# Patient Record
Sex: Female | Born: 1955
Health system: Southern US, Community
[De-identification: ages and names within clinical notes are randomized; demographics above are authoritative.]

## PROBLEM LIST (undated history)

## (undated) DIAGNOSIS — T7840XA Allergy, unspecified, initial encounter: Secondary | ICD-10-CM

## (undated) DIAGNOSIS — G4700 Insomnia, unspecified: Secondary | ICD-10-CM

## (undated) DIAGNOSIS — R0600 Dyspnea, unspecified: Secondary | ICD-10-CM

## (undated) DIAGNOSIS — F329 Major depressive disorder, single episode, unspecified: Secondary | ICD-10-CM

## (undated) DIAGNOSIS — F32A Depression, unspecified: Secondary | ICD-10-CM

## (undated) DIAGNOSIS — T753XXA Motion sickness, initial encounter: Secondary | ICD-10-CM

## (undated) HISTORY — DX: Allergy, unspecified, initial encounter: T78.40XA

## (undated) HISTORY — DX: Insomnia, unspecified: G47.00

---

## 2008-01-07 HISTORY — PX: COLONOSCOPY: SHX174

## 2015-09-28 ENCOUNTER — Ambulatory Visit (INDEPENDENT_AMBULATORY_CARE_PROVIDER_SITE_OTHER): Payer: 59 | Admitting: Family Medicine

## 2015-09-28 ENCOUNTER — Encounter: Payer: Self-pay | Admitting: Family Medicine

## 2015-09-28 VITALS — BP 120/78 | HR 78 | Ht 66.0 in | Wt 231.0 lb

## 2015-09-28 DIAGNOSIS — Z7689 Persons encountering health services in other specified circumstances: Secondary | ICD-10-CM

## 2015-09-28 DIAGNOSIS — G4700 Insomnia, unspecified: Secondary | ICD-10-CM

## 2015-09-28 DIAGNOSIS — Z7189 Other specified counseling: Secondary | ICD-10-CM | POA: Diagnosis not present

## 2015-09-28 DIAGNOSIS — F329 Major depressive disorder, single episode, unspecified: Secondary | ICD-10-CM | POA: Insufficient documentation

## 2015-09-28 DIAGNOSIS — F32A Depression, unspecified: Secondary | ICD-10-CM

## 2015-09-28 MED ORDER — BUPROPION HCL 75 MG PO TABS: 75.0000 mg | ORAL_TABLET | Freq: Two times a day (BID) | ORAL | 2 refills | Status: DC

## 2015-09-28 MED ORDER — TRAZODONE HCL 100 MG PO TABS: 100.0000 mg | ORAL_TABLET | Freq: Every day | ORAL | 3 refills | Status: DC

## 2015-09-28 NOTE — Progress Notes (Signed)
Name: Rachel Burton   MRN: [id number]    DOB: [birth year]   Date:09/28/2015       Progress Note  Subjective  Chief Complaint  Chief Complaint  Patient presents with  . Establish Care    new to area- in June    Patient presents for establishment of care.   Depression         The patient presents with no depression.  This is a chronic problem.  The current episode started more than 1 month ago.   The onset quality is gradual.   The problem occurs daily.  The problem has been waxing and waning since onset.  Associated symptoms include sad.  Associated symptoms include no decreased concentration, no fatigue, no helplessness, no hopelessness, does not have insomnia, not irritable, no restlessness, no decreased interest, no appetite change, no body aches, no myalgias, no headaches, no indigestion and no suicidal ideas.     The symptoms are aggravated by work stress.  Treatments tried: viibrid.  Compliance with treatment is good.  Past compliance problems include medication issues (prior medications not tolereated).   Pertinent negatives include no chronic pain, no fibromyalgia, no hypothyroidism, no Alzheimer's disease, no dementia and no depression.   No problem-specific Assessment & Plan notes found for this encounter.   Past Medical History:  Diagnosis Date  . Insomnia     Past Surgical History:  Procedure Laterality Date  . COLONOSCOPY  2010   cleared for 10 years    Family History  Problem Relation Age of Onset  . Cancer Mother   . Heart disease Maternal Grandmother   . Heart disease Maternal Grandfather     Social History   Social History  . Marital status: Unknown    Spouse name: N/A  . Number of children: N/A  . Years of education: N/A   Occupational History  . Not on file.   Social History Main Topics  . Smoking status: Former Games developermoker  . Smokeless tobacco: Never Used  . Alcohol use No  . Drug use: No  . Sexual activity: No   Other Topics Concern  . Not on  file   Social History Narrative  . No narrative on file    Allergies  Allergen Reactions  . Ambien [Zolpidem]   . Levaquin [Levofloxacin]      Review of Systems  Constitutional: Negative for appetite change, chills, fatigue, fever, malaise/fatigue and weight loss.  HENT: Negative for ear discharge, ear pain and sore throat.   Eyes: Negative for blurred vision.  Respiratory: Negative for cough, sputum production, shortness of breath and wheezing.   Cardiovascular: Negative for chest pain, palpitations and leg swelling.  Gastrointestinal: Negative for abdominal pain, blood in stool, constipation, diarrhea, heartburn, melena and nausea.  Genitourinary: Negative for dysuria, frequency, hematuria and urgency.  Musculoskeletal: Negative for back pain, joint pain, myalgias and neck pain.  Skin: Negative for rash.  Neurological: Negative for dizziness, tingling, sensory change, focal weakness and headaches.  Endo/Heme/Allergies: Negative for environmental allergies and polydipsia. Does not bruise/bleed easily.  Psychiatric/Behavioral: Negative for decreased concentration, depression and suicidal ideas. The patient is not nervous/anxious and does not have insomnia.      Objective  Vitals:   09/28/15 1359  BP: 120/78  Pulse: 78  Weight: 231 lb (104.8 kg)  Height: 5\' 6"  (1.676 m)    Physical Exam  Constitutional: She is well-developed, well-nourished, and in no distress. She is not irritable. No distress.  HENT:  Head: Normocephalic and  atraumatic.  Right Ear: External ear normal.  Left Ear: External ear normal.  Nose: Nose normal.  Mouth/Throat: Oropharynx is clear and moist.  Eyes: Conjunctivae and EOM are normal. Pupils are equal, round, and reactive to light. Right eye exhibits no discharge. Left eye exhibits no discharge.  Neck: Normal range of motion. Neck supple. No JVD present. No thyromegaly present.  Cardiovascular: Normal rate, regular rhythm, normal heart sounds and  intact distal pulses.  Exam reveals no gallop and no friction rub.   No murmur heard. Pulmonary/Chest: Effort normal and breath sounds normal. She has no wheezes. She has no rales.  Abdominal: Soft. Bowel sounds are normal. She exhibits no mass. There is no tenderness. There is no guarding.  Musculoskeletal: Normal range of motion. She exhibits no edema.  Lymphadenopathy:    She has no cervical adenopathy.  Neurological: She is alert.  Skin: Skin is warm and dry. She is not diaphoretic.  Psychiatric: Mood and affect normal.  Nursing note and vitals reviewed.     Assessment & Plan  Problem List Items Addressed This Visit      Other   Insomnia   Relevant Medications   traZODone (DESYREL) 100 MG tablet   Depression   Relevant Medications   Vilazodone HCl (VIIBRYD) 40 MG TABS   traZODone (DESYREL) 100 MG tablet   buPROPion (WELLBUTRIN) 75 MG tablet    Other Visit Diagnoses    Encounter to establish care    -  Primary        Dr. Elizabeth Sauer [facility]  09/28/15

## 2015-10-11 DIAGNOSIS — H40003 Preglaucoma, unspecified, bilateral: Secondary | ICD-10-CM | POA: Diagnosis not present

## 2015-11-07 DIAGNOSIS — H40003 Preglaucoma, unspecified, bilateral: Secondary | ICD-10-CM | POA: Diagnosis not present

## 2015-11-13 DIAGNOSIS — H40003 Preglaucoma, unspecified, bilateral: Secondary | ICD-10-CM | POA: Diagnosis not present

## 2015-12-28 ENCOUNTER — Encounter: Payer: Self-pay | Admitting: Family Medicine

## 2015-12-28 ENCOUNTER — Ambulatory Visit (INDEPENDENT_AMBULATORY_CARE_PROVIDER_SITE_OTHER): Payer: PRIVATE HEALTH INSURANCE | Admitting: Family Medicine

## 2015-12-28 VITALS — BP 124/80 | HR 60 | Ht 66.0 in | Wt 232.0 lb

## 2015-12-28 DIAGNOSIS — F5101 Primary insomnia: Secondary | ICD-10-CM | POA: Diagnosis not present

## 2015-12-28 DIAGNOSIS — F3341 Major depressive disorder, recurrent, in partial remission: Secondary | ICD-10-CM | POA: Diagnosis not present

## 2015-12-28 MED ORDER — TRAZODONE HCL 100 MG PO TABS: 100.0000 mg | ORAL_TABLET | Freq: Every day | ORAL | 3 refills | Status: DC

## 2015-12-28 MED ORDER — BUPROPION HCL 75 MG PO TABS: 75.0000 mg | ORAL_TABLET | Freq: Two times a day (BID) | ORAL | 3 refills | Status: DC

## 2015-12-28 NOTE — Progress Notes (Signed)
Name: Rachel Burton   MRN: [id number]    DOB: 1955/02/03   Date:12/28/2015       Progress Note  Subjective  Chief Complaint  Chief Complaint  Patient presents with  . Depression    started Bupropion- "doing great on med"- needs refill on both Bupropion and Trazadone    Depression         This is a new problem.  The current episode started more than 1 year ago.   The onset quality is gradual.   The problem occurs intermittently.  The problem has been gradually improving since onset.  Associated symptoms include no decreased concentration, no fatigue, no helplessness, no hopelessness, does not have insomnia, not irritable, no restlessness, no decreased interest, no appetite change, no body aches, no myalgias, no headaches, no indigestion, not sad and no suicidal ideas.     The symptoms are aggravated by nothing.  Treatments tried: buspirone.  Compliance with treatment is good.  Previous treatment provided moderate relief.  Risk factors include a change in medication usage/dosage.    Pertinent negatives include no chronic fatigue syndrome and no chronic pain.   No problem-specific Assessment & Plan notes found for this encounter.   Past Medical History:  Diagnosis Date  . Insomnia     Past Surgical History:  Procedure Laterality Date  . COLONOSCOPY  2010   cleared for 10 years    Family History  Problem Relation Age of Onset  . Cancer Mother   . Heart disease Maternal Grandmother   . Heart disease Maternal Grandfather     Social History   Social History  . Marital status: Unknown    Spouse name: N/A  . Number of children: N/A  . Years of education: N/A   Occupational History  . Not on file.   Social History Main Topics  . Smoking status: Former Games developermoker  . Smokeless tobacco: Never Used  . Alcohol use No  . Drug use: No  . Sexual activity: No   Other Topics Concern  . Not on file   Social History Narrative  . No narrative on file    Allergies  Allergen  Reactions  . Ambien [Zolpidem]   . Levaquin [Levofloxacin]      Review of Systems  Constitutional: Negative for appetite change, chills, fatigue, fever, malaise/fatigue and weight loss.  HENT: Negative for ear discharge, ear pain and sore throat.   Eyes: Negative for blurred vision.  Respiratory: Negative for cough, sputum production, shortness of breath and wheezing.   Cardiovascular: Negative for chest pain, palpitations and leg swelling.  Gastrointestinal: Negative for abdominal pain, blood in stool, constipation, diarrhea, heartburn, melena and nausea.  Genitourinary: Negative for dysuria, frequency, hematuria and urgency.  Musculoskeletal: Negative for back pain, joint pain, myalgias and neck pain.  Skin: Negative for rash.  Neurological: Negative for dizziness, tingling, sensory change, focal weakness and headaches.  Endo/Heme/Allergies: Negative for environmental allergies and polydipsia. Does not bruise/bleed easily.  Psychiatric/Behavioral: Positive for depression. Negative for decreased concentration and suicidal ideas. The patient is not nervous/anxious and does not have insomnia.      Objective  Vitals:   12/28/15 0914  BP: 124/80  Pulse: 60  Weight: 232 lb (105.2 kg)  Height: 5\' 6"  (1.676 m)    Physical Exam  Constitutional: She is well-developed, well-nourished, and in no distress. She is not irritable. No distress.  HENT:  Head: Normocephalic and atraumatic.  Right Ear: External ear normal.  Left Ear: External ear normal.  Nose: Nose normal.  Mouth/Throat: Oropharynx is clear and moist.  Eyes: Conjunctivae and EOM are normal. Pupils are equal, round, and reactive to light. Right eye exhibits no discharge. Left eye exhibits no discharge.  Neck: Normal range of motion. Neck supple. No JVD present. No thyromegaly present.  Cardiovascular: Normal rate, regular rhythm, normal heart sounds and intact distal pulses.  Exam reveals no gallop and no friction rub.   No  murmur heard. Pulmonary/Chest: Effort normal and breath sounds normal. She has no wheezes. She has no rales.  Abdominal: Soft. Bowel sounds are normal. She exhibits no mass. There is no tenderness. There is no guarding.  Musculoskeletal: Normal range of motion. She exhibits no edema.  Lymphadenopathy:    She has no cervical adenopathy.  Neurological: She is alert. She has normal reflexes.  Skin: Skin is warm and dry. She is not diaphoretic.  Psychiatric: Mood and affect normal.  Nursing note and vitals reviewed.     Assessment & Plan  Problem List Items Addressed This Visit      Other   Insomnia   Relevant Medications   traZODone (DESYREL) 100 MG tablet   Depression - Primary   Relevant Medications   buPROPion (WELLBUTRIN) 75 MG tablet   traZODone (DESYREL) 100 MG tablet        Dr. Hayden Rasmusseneanna Azyria [facility]mon [facility]  12/28/15

## 2016-01-10 ENCOUNTER — Ambulatory Visit: Payer: PRIVATE HEALTH INSURANCE | Admitting: Family Medicine

## 2016-01-10 ENCOUNTER — Ambulatory Visit
Admission: EM | Admit: 2016-01-10 | Discharge: 2016-01-10 | Disposition: A | Discharge status: Home / Self Care | Payer: No Typology Code available for payment source | Attending: Family Medicine | Admitting: Family Medicine

## 2016-01-10 DIAGNOSIS — J069 Acute upper respiratory infection, unspecified: Secondary | ICD-10-CM | POA: Diagnosis not present

## 2016-01-10 HISTORY — DX: Major depressive disorder, single episode, unspecified: F32.9

## 2016-01-10 HISTORY — DX: Depression, unspecified: F32.A

## 2016-01-10 LAB — RAPID INFLUENZA A&B ANTIGENS (ARMC ONLY): INFLUENZA A (ARMC): NEGATIVE

## 2016-01-10 LAB — RAPID INFLUENZA A&B ANTIGENS: Influenza B (ARMC): NEGATIVE

## 2016-01-10 MED ORDER — ALBUTEROL SULFATE HFA 108 (90 BASE) MCG/ACT IN AERS: 1.0000 | INHALATION_SPRAY | Freq: Four times a day (QID) | RESPIRATORY_TRACT | 0 refills | Status: DC | PRN

## 2016-01-10 MED ORDER — BENZONATATE 200 MG PO CAPS: ORAL_CAPSULE | ORAL | 0 refills | Status: DC

## 2016-01-10 MED ORDER — HYDROCOD POLST-CPM POLST ER 10-8 MG/5ML PO SUER: 5.0000 mL | Freq: Two times a day (BID) | ORAL | 0 refills | Status: DC

## 2016-01-10 MED ORDER — AZITHROMYCIN 250 MG PO TABS: 250.0000 mg | ORAL_TABLET | Freq: Every day | ORAL | 0 refills | Status: DC

## 2016-01-10 NOTE — ED Provider Notes (Signed)
CSN: [id number]     Arrival date & time 01/10/16  1252 History   First MD Initiated Contact with Patient 01/10/16 1554     Chief Complaint  Patient presents with  . Cough   (Consider location/radiation/quality/duration/timing/severity/associated sxs/prior Treatment) HPI  This a 61 year old female who presents with a nonproductive cough bloody mucus from her nose and mild shortness of breath. She's had this for about a week. His not improving. Is coughing all night long. 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 She has a temperature of 100F pressure 135/78 pulse 92 respirations 20 O2 sat on room air is 95%      Past Medical History:  Diagnosis Date  . Depression   . Insomnia    Past Surgical History:  Procedure Laterality Date  . COLONOSCOPY  2010   cleared for 10 years   Family History  Problem Relation Age of Onset  . Cancer Mother   . Heart disease Maternal Grandmother   . Heart disease Maternal Grandfather    Social History  Substance Use Topics  . Smoking status: Never Smoker  . Smokeless tobacco: Never Used  . Alcohol use No   OB History    No data available     Review of Systems  Constitutional: Positive for activity change. Negative for chills, fatigue and fever.  HENT: Positive for congestion, postnasal drip, rhinorrhea, sinus pain, sinus pressure and sneezing.   Respiratory: Positive for cough and shortness of breath.   Cardiovascular: Positive for chest pain.  All other systems reviewed and are negative.   Allergies  Ambien [zolpidem] and Levaquin [levofloxacin]  Home Medications   Prior to Admission medications   Medication Sig Start Date End Date Taking? Authorizing Provider  albuterol (PROVENTIL HFA;VENTOLIN HFA) 108 (90 Base) MCG/ACT inhaler Inhale 1-2 puffs into the lungs every 6 (six) hours as needed for wheezing or shortness of breath. Use with spacer 01/10/16   Lutricia FeilWilliam P Heiress Williamson, PA-C  azithromycin (ZITHROMAX) 250 MG tablet  Take 1 tablet (250 mg total) by mouth daily. Take first 2 tablets together, then 1 every day until finished. 01/10/16   Lutricia FeilWilliam P Arianny Pun, PA-C  benzonatate (TESSALON) 200 MG capsule Take 1 capsule 3 times daily as necessary for cough 01/10/16   Lutricia FeilWilliam P Aren Pryde, PA-C  buPROPion (WELLBUTRIN) 75 MG tablet Take 1 tablet (75 mg total) by mouth 2 (two) times daily. 12/28/15   Duanne Limerickeanna C Jones, MD  chlorpheniramine-HYDROcodone (TUSSIONEX PENNKINETIC ER) 10-8 MG/5ML SUER Take 5 mLs by mouth 2 (two) times daily. 01/10/16   Lutricia FeilWilliam P Rendi Mapel, PA-C  traZODone (DESYREL) 100 MG tablet Take 1 tablet (100 mg total) by mouth at bedtime. 12/28/15   Duanne Limerickeanna C Jones, MD   Meds Ordered and Administered this Visit  Medications - No data to display  BP 135/78 (BP Location: Left Arm)   Pulse 92   Temp 100 F (37.8 C) (Oral)   Resp 20   Ht 5\' 5"  (1.651 m)   Wt 223 lb (101.2 kg)   SpO2 95%   BMI 37.11 kg/m  No data found.   Physical Exam  Constitutional: She is oriented to person, place, and time. She appears well-developed and well-nourished. No distress.  HENT:  Head: Normocephalic and atraumatic.  Right Ear: External ear normal.  Left Ear: External ear normal.  Nose: Nose normal.  Mouth/Throat: Oropharynx is clear and moist. No oropharyngeal exudate.  Patient does have  tenderness to percussion over the maxillary sinuses  Eyes: EOM are normal. Pupils are equal, round, and reactive to light. Right eye exhibits no discharge. Left eye exhibits no discharge.  Neck: Normal range of motion. Neck supple.  Pulmonary/Chest: Effort normal and breath sounds normal. No respiratory distress. She has no wheezes. She has no rales.  Musculoskeletal: Normal range of motion.  Lymphadenopathy:    She has no cervical adenopathy.  Neurological: She is alert and oriented to person, place, and time.  Skin: Skin is warm and dry. She is not diaphoretic.  Psychiatric: She has a normal mood and affect. Her behavior is normal. Judgment  and thought content normal.  Nursing note and vitals reviewed.   Urgent Care Course   Clinical Course     Procedures (including critical care time)  Labs Review Labs Reviewed  RAPID INFLUENZA A&B ANTIGENS [facility] ONLY)    Imaging Review No results found.   Visual Acuity Review  Right Eye Distance:   Left Eye Distance:   Bilateral Distance:    Right Eye Near:   Left Eye Near:    Bilateral Near:         MDM   1. Acute upper respiratory infection    Discharge Medication List as of 01/10/2016  4:10 PM    START taking these medications   Details  albuterol (PROVENTIL HFA;VENTOLIN HFA) 108 (90 Base) MCG/ACT inhaler Inhale 1-2 puffs into the lungs every 6 (six) hours as needed for wheezing or shortness of breath. Use with spacer, Starting Thu 01/10/2016, Normal    azithromycin (ZITHROMAX) 250 MG tablet Take 1 tablet (250 mg total) by mouth daily. Take first 2 tablets together, then 1 every day until finished., Starting Thu 01/10/2016, Normal    benzonatate (TESSALON) 200 MG capsule Take 1 capsule 3 times daily as necessary for cough, Normal    chlorpheniramine-HYDROcodone (TUSSIONEX PENNKINETIC ER) 10-8 MG/5ML SUER Take 5 mLs by mouth 2 (two) times daily., Starting Thu 01/10/2016, Print      Plan: 1. Test/x-ray results and diagnosis reviewed with patient 2. rx as per orders; risks, benefits, potential side effects reviewed with patient 3. Recommend supportive treatment with Rest and fluids. Use albuterol for shortness of breath. And cautioned her regarding use of Tussionex with activities requiring concentration are judgment. She is not improving or if she runs high fevers or increased sputum production she should return to our clinic for follow-up or see her primary care physician 4. F/u prn if symptoms worsen or don't improve     Lutricia Feil, PA-C 01/10/16 1652

## 2016-01-10 NOTE — ED Triage Notes (Addendum)
Pt with dry cough, blowing bloody secretions from nose.Rachel Burton. No pain

## 2016-04-28 ENCOUNTER — Encounter: Payer: Self-pay | Admitting: Family Medicine

## 2016-04-28 ENCOUNTER — Ambulatory Visit (INDEPENDENT_AMBULATORY_CARE_PROVIDER_SITE_OTHER): Payer: No Typology Code available for payment source | Admitting: Family Medicine

## 2016-04-28 VITALS — BP 130/78 | HR 60 | Ht 65.0 in | Wt 230.0 lb

## 2016-04-28 DIAGNOSIS — F3341 Major depressive disorder, recurrent, in partial remission: Secondary | ICD-10-CM

## 2016-04-28 DIAGNOSIS — Z6838 Body mass index (BMI) 38.0-38.9, adult: Secondary | ICD-10-CM

## 2016-04-28 DIAGNOSIS — Z1211 Encounter for screening for malignant neoplasm of colon: Secondary | ICD-10-CM | POA: Diagnosis not present

## 2016-04-28 DIAGNOSIS — Z1159 Encounter for screening for other viral diseases: Secondary | ICD-10-CM

## 2016-04-28 DIAGNOSIS — E6609 Other obesity due to excess calories: Secondary | ICD-10-CM | POA: Diagnosis not present

## 2016-04-28 DIAGNOSIS — F5101 Primary insomnia: Secondary | ICD-10-CM | POA: Diagnosis not present

## 2016-04-28 DIAGNOSIS — N6459 Other signs and symptoms in breast: Secondary | ICD-10-CM

## 2016-04-28 DIAGNOSIS — Z0000 Encounter for general adult medical examination without abnormal findings: Secondary | ICD-10-CM | POA: Diagnosis not present

## 2016-04-28 DIAGNOSIS — M7542 Impingement syndrome of left shoulder: Secondary | ICD-10-CM | POA: Diagnosis not present

## 2016-04-28 MED ORDER — TRAZODONE HCL 100 MG PO TABS: 100.0000 mg | ORAL_TABLET | Freq: Every day | ORAL | 3 refills | Status: DC

## 2016-04-28 MED ORDER — BUPROPION HCL 75 MG PO TABS: 75.0000 mg | ORAL_TABLET | Freq: Two times a day (BID) | ORAL | 3 refills | Status: DC

## 2016-04-28 NOTE — Patient Instructions (Signed)

## 2016-04-28 NOTE — Progress Notes (Signed)
Name: Rachel Burton   MRN: [id number]    DOB: April 18, 1955   Date:04/28/2016       Progress Note  Subjective  Chief Complaint  Chief Complaint  Patient presents with  . Annual Exam    no pap, needs mammo    Patient presents for annual physical exam.   Depression         This is a chronic problem.  The current episode started more than 1 year ago.   The onset quality is gradual.   The problem occurs intermittently.  The problem has been waxing and waning ("in a rut") since onset.  Associated symptoms include no decreased concentration, no fatigue, no helplessness, no hopelessness, does not have insomnia, not irritable, no restlessness, no decreased interest, no appetite change, no body aches, no myalgias, no headaches, no indigestion, not sad and no suicidal ideas.     The symptoms are aggravated by medication.  Past treatments include SSRIs - Selective serotonin reuptake inhibitors.  Compliance with treatment is good.  Previous treatment provided mild relief.   No problem-specific Assessment & Plan notes found for this encounter.   Past Medical History:  Diagnosis Date  . Depression   . Insomnia     Past Surgical History:  Procedure Laterality Date  . COLONOSCOPY  2010   cleared for 10 years    Family History  Problem Relation Age of Onset  . Cancer Mother   . Heart disease Maternal Grandmother   . Heart disease Maternal Grandfather     Social History   Social History  . Marital status: Unknown    Spouse name: N/A  . Number of children: N/A  . Years of education: N/A   Occupational History  . Not on file.   Social History Main Topics  . Smoking status: Never Smoker  . Smokeless tobacco: Never Used  . Alcohol use No  . Drug use: No  . Sexual activity: No   Other Topics Concern  . Not on file   Social History Narrative  . No narrative on file    Allergies  Allergen Reactions  . Ambien [Zolpidem]   . Levaquin [Levofloxacin]     Outpatient Medications  Prior to Visit  Medication Sig Dispense Refill  . buPROPion (WELLBUTRIN) 75 MG tablet Take 1 tablet (75 mg total) by mouth 2 (two) times daily. 180 tablet 3  . traZODone (DESYREL) 100 MG tablet Take 1 tablet (100 mg total) by mouth at bedtime. 90 tablet 3  . albuterol (PROVENTIL HFA;VENTOLIN HFA) 108 (90 Base) MCG/ACT inhaler Inhale 1-2 puffs into the lungs every 6 (six) hours as needed for wheezing or shortness of breath. Use with spacer 1 Inhaler 0  . azithromycin (ZITHROMAX) 250 MG tablet Take 1 tablet (250 mg total) by mouth daily. Take first 2 tablets together, then 1 every day until finished. 6 tablet 0  . benzonatate (TESSALON) 200 MG capsule Take 1 capsule 3 times daily as necessary for cough 21 capsule 0  . chlorpheniramine-HYDROcodone (TUSSIONEX PENNKINETIC ER) 10-8 MG/5ML SUER Take 5 mLs by mouth 2 (two) times daily. 115 mL 0   No facility-administered medications prior to visit.     Review of Systems  Constitutional: Negative for appetite change, chills, fatigue, fever, malaise/fatigue and weight loss.  HENT: Negative for congestion, ear discharge, ear pain, hearing loss, sinus pain, sore throat and tinnitus.   Eyes: Negative for blurred vision, pain, discharge and redness.  Respiratory: Negative for cough, sputum production, shortness of breath  and wheezing.   Cardiovascular: Negative for chest pain, palpitations and leg swelling.  Gastrointestinal: Negative for abdominal pain, blood in stool, constipation, diarrhea, heartburn, melena and nausea.  Genitourinary: Negative for dysuria, frequency, hematuria and urgency.  Musculoskeletal: Negative for back pain, joint pain, myalgias and neck pain.  Skin: Negative for itching and rash.  Neurological: Negative for dizziness, tingling, sensory change, focal weakness and headaches.  Endo/Heme/Allergies: Negative for environmental allergies and polydipsia. Does not bruise/bleed easily.  Psychiatric/Behavioral: Positive for depression.  Negative for decreased concentration and suicidal ideas. The patient is not nervous/anxious and does not have insomnia.      Objective  Vitals:   04/28/16 1001  BP: 130/78  Pulse: 60  Weight: 230 lb (104.3 kg)  Height:  (1.651 m)    Physical Exam  Constitutional: She is oriented to person, place, and time and well-developed, well-nourished, and in no distress. She is not irritable. No distress.  HENT:  Head: Normocephalic and atraumatic.  Right Ear: Tympanic membrane, external ear and ear canal normal.  Left Ear: Tympanic membrane, external ear and ear canal normal.  Nose: Nose normal.  Mouth/Throat: Uvula is midline, oropharynx is clear and moist and mucous membranes are normal.  Eyes: Conjunctivae, EOM and lids are normal. Pupils are equal, round, and reactive to light. Right eye exhibits no discharge. Left eye exhibits no discharge.  Fundoscopic exam:      The right eye shows no arteriolar narrowing, no AV nicking and no papilledema.       The left eye shows no arteriolar narrowing, no AV nicking and no papilledema.  Neck: Trachea normal, normal range of motion and full passive range of motion without pain. Neck supple. Normal carotid pulses, no hepatojugular reflux and no JVD present. Carotid bruit is not present. No thyromegaly present.  Cardiovascular: Normal rate, regular rhythm, S1 normal, S2 normal, normal heart sounds, intact distal pulses and normal pulses.  Exam reveals no gallop, no S3, no S4 and no friction rub.   No murmur heard. Pulmonary/Chest: Effort normal and breath sounds normal. Right breast exhibits no inverted nipple, no mass, no nipple discharge, no skin change and no tenderness. Left breast exhibits no inverted nipple, no mass, no nipple discharge, no skin change and no tenderness. Breasts are asymmetrical.  Fullness right breast 10 o clock  Abdominal: Soft. Normal aorta and bowel sounds are normal. She exhibits no mass. There is no hepatosplenomegaly.  There is no tenderness. There is no rebound, no guarding and no CVA tenderness.  Genitourinary: Rectum normal. Rectal exam shows no external hemorrhoid.  Musculoskeletal: She exhibits no edema.       Left shoulder: She exhibits decreased range of motion and tenderness.       Cervical back: Normal.       Thoracic back: Normal.       Lumbar back: Normal.  Lymphadenopathy:       Head (right side): No submental and no submandibular adenopathy present.       Head (left side): No submental and no submandibular adenopathy present.    She has no cervical adenopathy.    She has no axillary adenopathy.  Neurological: She is alert and oriented to person, place, and time. She has normal motor skills, normal sensation, normal strength, normal reflexes and intact cranial nerves. No cranial nerve deficit.  Skin: Skin is warm, dry and intact. She is not diaphoretic.  Psychiatric: Mood and affect normal.  Nursing note and vitals reviewed.  Assessment & Plan  Problem List Items Addressed This Visit      Other   Insomnia   Relevant Medications   traZODone (DESYREL) 100 MG tablet   Depression   Relevant Medications   buPROPion (WELLBUTRIN) 75 MG tablet   traZODone (DESYREL) 100 MG tablet    Other Visit Diagnoses    Annual physical exam    -  Primary   Relevant Orders   Renal Function Panel   Class 2 obesity due to excess calories without serious comorbidity with body mass index (BMI) of 38.0 to 38.9 in adult       Relevant Orders   Renal Function Panel   Lipid Profile   Impingement syndrome of left shoulder       Relevant Orders   Ambulatory referral to Orthopedic Surgery   Abnormal breast exam       Relevant Orders   MM Digital Screening   Screen for colon cancer       hemoccults   Need for hepatitis C screening test       Relevant Orders   Hepatitis C antibody      Meds ordered this encounter  Medications  . buPROPion (WELLBUTRIN) 75 MG tablet    Sig: Take 1 tablet (75 mg  total) by mouth 2 (two) times daily.    Dispense:  180 tablet    Refill:  3  . traZODone (DESYREL) 100 MG tablet    Sig: Take 1 tablet (100 mg total) by mouth at bedtime.    Dispense:  90 tablet    Refill:  3      Dr. Elizabeth Sauer [facility]  04/28/16

## 2016-04-29 LAB — RENAL FUNCTION PANEL
Albumin: 4 g/dL (ref 3.6–4.8)
BUN / CREAT RATIO: 23 (ref 12–28)
BUN: 19 mg/dL (ref 8–27)
CALCIUM: 9.1 mg/dL (ref 8.7–10.3)
CO2: 28 mmol/L (ref 18–29)
CREATININE: 0.81 mg/dL (ref 0.57–1.00)
Chloride: 100 mmol/L (ref 96–106)
GFR calc Af Amer: 89 mL/min/{1.73_m2} (ref >59)
GFR calc non Af Amer: 78 mL/min/{1.73_m2} (ref >59)
Glucose: 86 mg/dL (ref 65–99)
PHOSPHORUS: 3.2 mg/dL (ref 2.5–4.5)
Potassium: 4.5 mmol/L (ref 3.5–5.2)
SODIUM: 142 mmol/L (ref 134–144)

## 2016-04-29 LAB — LIPID PANEL
CHOLESTEROL TOTAL: 203 mg/dL — AB (ref 100–199)
Chol/HDL Ratio: 2.2 ratio (ref 0.0–4.4)
HDL: 94 mg/dL (ref >39)
LDL CALC: 100 mg/dL — AB (ref 0–99)
TRIGLYCERIDES: 43 mg/dL (ref 0–149)
VLDL CHOLESTEROL CAL: 9 mg/dL (ref 5–40)

## 2016-04-29 LAB — HEPATITIS C ANTIBODY: Hep C Virus Ab: <0.1 s/co ratio (ref 0.0–0.9)

## 2016-05-12 ENCOUNTER — Inpatient Hospital Stay
Admission: RE | Admit: 2016-05-12 | Discharge: 2016-05-12 | Disposition: A | Discharge status: Home / Self Care | Payer: Self-pay | Source: Ambulatory Visit | Attending: *Deleted | Admitting: *Deleted

## 2016-05-12 ENCOUNTER — Other Ambulatory Visit: Payer: Self-pay

## 2016-05-12 ENCOUNTER — Other Ambulatory Visit: Payer: Self-pay | Admitting: *Deleted

## 2016-05-12 DIAGNOSIS — N6459 Other signs and symptoms in breast: Secondary | ICD-10-CM

## 2016-05-12 DIAGNOSIS — Z9289 Personal history of other medical treatment: Secondary | ICD-10-CM

## 2016-08-28 ENCOUNTER — Encounter: Payer: Self-pay | Admitting: Family Medicine

## 2016-12-16 ENCOUNTER — Other Ambulatory Visit: Payer: Self-pay

## 2016-12-16 ENCOUNTER — Encounter: Payer: Self-pay | Admitting: Family Medicine

## 2016-12-16 ENCOUNTER — Ambulatory Visit (INDEPENDENT_AMBULATORY_CARE_PROVIDER_SITE_OTHER): Payer: No Typology Code available for payment source | Admitting: Family Medicine

## 2016-12-16 VITALS — BP 120/86 | HR 88 | Temp 98.3°F | Ht 65.0 in | Wt 244.0 lb

## 2016-12-16 DIAGNOSIS — F3341 Major depressive disorder, recurrent, in partial remission: Secondary | ICD-10-CM

## 2016-12-16 DIAGNOSIS — J40 Bronchitis, not specified as acute or chronic: Secondary | ICD-10-CM | POA: Diagnosis not present

## 2016-12-16 MED ORDER — BUPROPION HCL 100 MG PO TABS: 100.0000 mg | ORAL_TABLET | Freq: Two times a day (BID) | ORAL | 2 refills | Status: DC

## 2016-12-16 MED ORDER — AMOXICILLIN-POT CLAVULANATE 875-125 MG PO TABS: 1.0000 | ORAL_TABLET | Freq: Two times a day (BID) | ORAL | 0 refills | Status: DC

## 2016-12-16 NOTE — Progress Notes (Signed)
Name: Rachel Burton   MRN: [id number]    DOB: 07/06/1955   Date:12/16/2016       Progress Note  Subjective  Chief Complaint  Chief Complaint  Patient presents with  . Sinusitis    cough and cong  . Depression    Sinusitis  This is a new problem. The current episode started in the past 7 days. The problem has been gradually worsening since onset. Maximum temperature: low grade. The fever has been present for 1 to 2 days. Associated symptoms include chills, congestion, coughing, diaphoresis, ear pain, headaches, sneezing and a sore throat. Pertinent negatives include no hoarse voice, neck pain, shortness of breath, sinus pressure or swollen glands. Past treatments include oral decongestants.  Depression         This is a chronic problem.  The current episode started more than 1 year ago.   The problem occurs every several days.  Associated symptoms include decreased interest, headaches and sad.  Associated symptoms include no decreased concentration, no fatigue, no helplessness, no hopelessness, does not have insomnia, not irritable, no restlessness, no appetite change, no body aches, no myalgias, no indigestion and no suicidal ideas.  Past treatments include SSRIs - Selective serotonin reuptake inhibitors.  Compliance with treatment is good.  Previous treatment provided mild relief. Cough  This is a new problem. The current episode started in the past 7 days. The problem has been waxing and waning. The cough is non-productive. Associated symptoms include chills, ear pain, headaches, a sore throat and sweats. Pertinent negatives include no chest pain, ear congestion, fever, heartburn, hemoptysis, myalgias, postnasal drip, rash, shortness of breath, weight loss or wheezing. The symptoms are aggravated by lying down. There is no history of environmental allergies.    No problem-specific Assessment & Plan notes found for this encounter.   Past Medical History:  Diagnosis Date  . Depression   .  Insomnia     Past Surgical History:  Procedure Laterality Date  . COLONOSCOPY  2010   cleared for 10 years    Family History  Problem Relation Age of Onset  . Cancer Mother   . Heart disease Maternal Grandmother   . Heart disease Maternal Grandfather     Social History   Socioeconomic History  . Marital status: Unknown    Spouse name: Not on file  . Number of children: Not on file  . Years of education: Not on file  . Highest education level: Not on file  Social Needs  . Financial resource strain: Not on file  . Food insecurity - worry: Not on file  . Food insecurity - inability: Not on file  . Transportation needs - medical: Not on file  . Transportation needs - non-medical: Not on file  Occupational History  . Not on file  Tobacco Use  . Smoking status: Never Smoker  . Smokeless tobacco: Never Used  Substance and Sexual Activity  . Alcohol use: No  . Drug use: No  . Sexual activity: No  Other Topics Concern  . Not on file  Social History Narrative  . Not on file    Allergies  Allergen Reactions  . Ambien [Zolpidem]   . Levaquin [Levofloxacin]     Outpatient Medications Prior to Visit  Medication Sig Dispense Refill  . traZODone (DESYREL) 100 MG tablet Take 1 tablet (100 mg total) by mouth at bedtime. 90 tablet 3  . buPROPion (WELLBUTRIN) 75 MG tablet Take 1 tablet (75 mg total) by mouth 2 (two)  times daily. 180 tablet 3   No facility-administered medications prior to visit.     Review of Systems  Constitutional: Positive for chills and diaphoresis. Negative for appetite change, fatigue, fever, malaise/fatigue and weight loss.  HENT: Positive for congestion, ear pain, sneezing and sore throat. Negative for ear discharge, hoarse voice, postnasal drip and sinus pressure.   Eyes: Negative for blurred vision.  Respiratory: Positive for cough. Negative for hemoptysis, sputum production, shortness of breath and wheezing.   Cardiovascular: Negative for chest  pain, palpitations and leg swelling.  Gastrointestinal: Negative for abdominal pain, blood in stool, constipation, diarrhea, heartburn, melena and nausea.  Genitourinary: Negative for dysuria, frequency, hematuria and urgency.  Musculoskeletal: Negative for back pain, joint pain, myalgias and neck pain.  Skin: Negative for rash.  Neurological: Positive for headaches. Negative for dizziness, tingling, sensory change and focal weakness.  Endo/Heme/Allergies: Negative for environmental allergies and polydipsia. Does not bruise/bleed easily.  Psychiatric/Behavioral: Positive for depression. Negative for decreased concentration and suicidal ideas. The patient is not nervous/anxious and does not have insomnia.      Objective  Vitals:   12/16/16 1540  BP: 120/86  Pulse: 88  Temp: 98.3 F (36.8 C)  Weight: 244 lb (110.7 kg)  Height: 5\' 5"  (1.651 m)    Physical Exam  Constitutional: She is well-developed, well-nourished, and in no distress. She is not irritable. No distress.  HENT:  Head: Normocephalic and atraumatic.  Right Ear: External ear normal.  Left Ear: External ear normal.  Nose: Nose normal.  Mouth/Throat: Oropharynx is clear and moist.  Eyes: Conjunctivae and EOM are normal. Pupils are equal, round, and reactive to light. Right eye exhibits no discharge. Left eye exhibits no discharge.  Neck: Normal range of motion. Neck supple. No JVD present. No thyromegaly present.  Cardiovascular: Normal rate, regular rhythm, normal heart sounds and intact distal pulses. Exam reveals no gallop and no friction rub.  No murmur heard. Pulmonary/Chest: Effort normal and breath sounds normal. She has no wheezes. She has no rales.  Abdominal: Soft. Bowel sounds are normal. She exhibits no mass. There is no tenderness. There is no guarding.  Musculoskeletal: Normal range of motion. She exhibits no edema.  Lymphadenopathy:    She has no cervical adenopathy.  Neurological: She is alert. She has  normal reflexes.  Skin: Skin is warm and dry. She is not diaphoretic.  Psychiatric: Mood and affect normal.  Nursing note and vitals reviewed.     Assessment & Plan  Problem List Items Addressed This Visit      Other   Depression   Relevant Medications   buPROPion (WELLBUTRIN) 100 MG tablet    Other Visit Diagnoses    Bronchitis    -  Primary   Relevant Medications   amoxicillin-clavulanate (AUGMENTIN) 875-125 MG tablet      Meds ordered this encounter  Medications  . amoxicillin-clavulanate (AUGMENTIN) 875-125 MG tablet    Sig: Take 1 tablet by mouth 2 (two) times daily.    Dispense:  20 tablet    Refill:  0  . buPROPion (WELLBUTRIN) 100 MG tablet    Sig: Take 1 tablet (100 mg total) by mouth 2 (two) times daily.    Dispense:  60 tablet    Refill:  2      Dr. Elizabeth [facility]uereanna Katee [facility]  12/16/16

## 2017-03-18 ENCOUNTER — Ambulatory Visit: Payer: No Typology Code available for payment source | Admitting: Family Medicine

## 2017-03-18 ENCOUNTER — Encounter: Payer: Self-pay | Admitting: Family Medicine

## 2017-03-18 VITALS — BP 132/82 | HR 89 | Resp 16 | Ht 65.0 in | Wt 244.0 lb

## 2017-03-18 DIAGNOSIS — F3341 Major depressive disorder, recurrent, in partial remission: Secondary | ICD-10-CM

## 2017-03-18 DIAGNOSIS — F5101 Primary insomnia: Secondary | ICD-10-CM

## 2017-03-18 MED ORDER — BUPROPION HCL 100 MG PO TABS: 100.0000 mg | ORAL_TABLET | Freq: Two times a day (BID) | ORAL | 8 refills | Status: DC

## 2017-03-18 MED ORDER — TRAZODONE HCL 100 MG PO TABS: 100.0000 mg | ORAL_TABLET | Freq: Every day | ORAL | 8 refills | Status: DC

## 2017-03-18 NOTE — Progress Notes (Signed)
Name: Rachel Burton   MRN: [id number]    DOB: Mar 15, 1955   Date:03/19/2017       Progress Note  Subjective  Chief Complaint  No chief complaint on file.   Depression         This is a new problem.  The current episode started more than 1 month ago.   The problem has been gradually improving since onset.  Associated symptoms include no decreased concentration, no fatigue, no helplessness, no hopelessness, does not have insomnia, not irritable, no restlessness, no decreased interest, no appetite change, no body aches, no myalgias, no headaches, no indigestion, not sad and no suicidal ideas.     The symptoms are aggravated by work stress.  Treatments tried: buproion/trazadone.  Compliance with treatment is good.  Previous treatment provided moderate relief.   No problem-specific Assessment & Plan notes found for this encounter.   Past Medical History:  Diagnosis Date  . Depression   . Insomnia     Past Surgical History:  Procedure Laterality Date  . COLONOSCOPY  2010   cleared for 10 years    Family History  Problem Relation Age of Onset  . Cancer Mother   . Heart disease Maternal Grandmother   . Heart disease Maternal Grandfather     Social History   Socioeconomic History  . Marital status: Unknown    Spouse name: Not on file  . Number of children: Not on file  . Years of education: Not on file  . Highest education level: Not on file  Social Needs  . Financial resource strain: Not on file  . Food insecurity - worry: Not on file  . Food insecurity - inability: Not on file  . Transportation needs - medical: Not on file  . Transportation needs - non-medical: Not on file  Occupational History  . Not on file  Tobacco Use  . Smoking status: Never Smoker  . Smokeless tobacco: Never Used  Substance and Sexual Activity  . Alcohol use: No  . Drug use: No  . Sexual activity: No  Other Topics Concern  . Not on file  Social History Narrative  . Not on file     Allergies  Allergen Reactions  . Levofloxacin Other (See Comments)  . Zolpidem Other (See Comments)    Outpatient Medications Prior to Visit  Medication Sig Dispense Refill  . buPROPion (WELLBUTRIN) 100 MG tablet Take 1 tablet (100 mg total) by mouth 2 (two) times daily. 60 tablet 2  . buPROPion (WELLBUTRIN) 75 MG tablet Take by mouth.    . traZODone (DESYREL) 100 MG tablet Take by mouth.    Marland Kitchen. amoxicillin-clavulanate (AUGMENTIN) 875-125 MG tablet Take 1 tablet by mouth 2 (two) times daily. 20 tablet 0  . traZODone (DESYREL) 100 MG tablet Take 1 tablet (100 mg total) by mouth at bedtime. 90 tablet 3   No facility-administered medications prior to visit.     Review of Systems  Constitutional: Negative for appetite change, chills, fatigue, fever, malaise/fatigue and weight loss.  HENT: Negative for ear discharge, ear pain and sore throat.   Eyes: Negative for blurred vision.  Respiratory: Negative for cough, sputum production, shortness of breath and wheezing.   Cardiovascular: Negative for chest pain, palpitations and leg swelling.  Gastrointestinal: Negative for abdominal pain, blood in stool, constipation, diarrhea, heartburn, melena and nausea.  Genitourinary: Negative for dysuria, frequency, hematuria and urgency.  Musculoskeletal: Negative for back pain, joint pain, myalgias and neck pain.  Skin: Negative for rash.  Neurological: Negative for dizziness, tingling, sensory change, focal weakness and headaches.  Endo/Heme/Allergies: Negative for environmental allergies and polydipsia. Does not bruise/bleed easily.  Psychiatric/Behavioral: Positive for depression. Negative for decreased concentration and suicidal ideas. The patient is not nervous/anxious and does not have insomnia.      Objective  Vitals:   03/18/17 1621  BP: 132/82  Pulse: 89  Resp: 16  SpO2: 96%  Weight: 244 lb (110.7 kg)  Height: 5\' 5"  (1.651 m)    Physical Exam  Constitutional: She is  well-developed, well-nourished, and in no distress. She is not irritable. No distress.  HENT:  Head: Normocephalic and atraumatic.  Right Ear: External ear normal.  Left Ear: External ear normal.  Nose: Nose normal.  Mouth/Throat: Oropharynx is clear and moist.  Eyes: Conjunctivae and EOM are normal. Pupils are equal, round, and reactive to light. Right eye exhibits no discharge. Left eye exhibits no discharge.  Neck: Normal range of motion. Neck supple. No JVD present. No thyromegaly present.  Cardiovascular: Normal rate, regular rhythm, normal heart sounds and intact distal pulses. Exam reveals no gallop and no friction rub.  No murmur heard. Pulmonary/Chest: Effort normal and breath sounds normal. She has no wheezes. She has no rales.  Abdominal: Soft. Bowel sounds are normal. She exhibits no mass. There is no tenderness. There is no guarding.  Musculoskeletal: Normal range of motion. She exhibits no edema.  Lymphadenopathy:    She has no cervical adenopathy.  Neurological: She is alert. She has normal reflexes.  Skin: Skin is warm and dry. She is not diaphoretic.  Psychiatric: Mood and affect normal.  Nursing note and vitals reviewed.     Assessment & Plan  Problem List Items Addressed This Visit      Other   Insomnia   Relevant Medications   traZODone (DESYREL) 100 MG tablet   Depression - Primary   Relevant Medications   buPROPion (WELLBUTRIN) 100 MG tablet   traZODone (DESYREL) 100 MG tablet      Meds ordered this encounter  Medications  . buPROPion (WELLBUTRIN) 100 MG tablet    Sig: Take 1 tablet (100 mg total) by mouth 2 (two) times daily.    Dispense:  60 tablet    Refill:  8  . traZODone (DESYREL) 100 MG tablet    Sig: Take 1 tablet (100 mg total) by mouth at bedtime.    Dispense:  30 tablet    Refill:  8      Dr. Elizabeth Sauer [facility]  03/19/17

## 2017-07-06 ENCOUNTER — Other Ambulatory Visit: Payer: Self-pay

## 2017-07-14 ENCOUNTER — Other Ambulatory Visit: Payer: Self-pay | Admitting: Family Medicine

## 2017-07-15 ENCOUNTER — Other Ambulatory Visit: Payer: Self-pay

## 2017-07-15 DIAGNOSIS — Z1231 Encounter for screening mammogram for malignant neoplasm of breast: Secondary | ICD-10-CM

## 2017-07-15 DIAGNOSIS — N6489 Other specified disorders of breast: Secondary | ICD-10-CM

## 2017-07-20 ENCOUNTER — Other Ambulatory Visit: Payer: No Typology Code available for payment source

## 2017-07-23 ENCOUNTER — Ambulatory Visit
Admission: RE | Admit: 2017-07-23 | Discharge: 2017-07-23 | Disposition: A | Discharge status: Home / Self Care | Payer: No Typology Code available for payment source | Source: Ambulatory Visit | Attending: Family Medicine | Admitting: Family Medicine

## 2017-07-23 DIAGNOSIS — N6489 Other specified disorders of breast: Secondary | ICD-10-CM

## 2017-07-23 DIAGNOSIS — Z1231 Encounter for screening mammogram for malignant neoplasm of breast: Secondary | ICD-10-CM

## 2017-12-28 ENCOUNTER — Ambulatory Visit (INDEPENDENT_AMBULATORY_CARE_PROVIDER_SITE_OTHER): Payer: PRIVATE HEALTH INSURANCE | Admitting: Family Medicine

## 2017-12-28 ENCOUNTER — Encounter: Payer: Self-pay | Admitting: Family Medicine

## 2017-12-28 DIAGNOSIS — F3341 Major depressive disorder, recurrent, in partial remission: Secondary | ICD-10-CM | POA: Diagnosis not present

## 2017-12-28 DIAGNOSIS — F5101 Primary insomnia: Secondary | ICD-10-CM | POA: Diagnosis not present

## 2017-12-28 MED ORDER — BUPROPION HCL 100 MG PO TABS: 100.0000 mg | ORAL_TABLET | Freq: Two times a day (BID) | ORAL | 1 refills | Status: DC

## 2017-12-28 MED ORDER — TRAZODONE HCL 100 MG PO TABS: 100.0000 mg | ORAL_TABLET | Freq: Every day | ORAL | 1 refills | Status: DC

## 2017-12-28 NOTE — Progress Notes (Signed)
Date:  12/28/2017   Name:  Rachel Burton   DOB:  1955/03/23   MRN:  [id number]   Chief Complaint: Depression (PHQ=0)  Depression         This is a chronic problem.  The current episode started more than 1 year ago.   The problem occurs intermittently.  The problem has been gradually improving since onset.  Associated symptoms include no decreased concentration, no fatigue, no helplessness, no hopelessness, does not have insomnia, not irritable, no restlessness, no decreased interest, no appetite change, no body aches, no myalgias, no headaches, no indigestion, not sad and no suicidal ideas.     The symptoms are aggravated by nothing.  Past treatments include other medications (bupropion/trazadone).  Compliance with treatment is good.  Previous treatment provided moderate relief.   Review of Systems  Constitutional: Negative.  Negative for appetite change, chills, fatigue, fever and unexpected weight change.  HENT: Negative for congestion, ear discharge, ear pain, rhinorrhea, sinus pressure, sneezing and sore throat.   Eyes: Negative for photophobia, pain, discharge, redness and itching.  Respiratory: Negative for cough, shortness of breath, wheezing and stridor.   Gastrointestinal: Negative for abdominal pain, blood in stool, constipation, diarrhea, nausea and vomiting.  Endocrine: Negative for cold intolerance, heat intolerance, polydipsia, polyphagia and polyuria.  Genitourinary: Negative for dysuria, flank pain, frequency, hematuria, menstrual problem, pelvic pain, urgency, vaginal bleeding and vaginal discharge.  Musculoskeletal: Negative for arthralgias, back pain and myalgias.  Skin: Negative for rash.  Allergic/Immunologic: Negative for environmental allergies and food allergies.  Neurological: Negative for dizziness, weakness, light-headedness, numbness and headaches.  Hematological: Negative for adenopathy. Does not bruise/bleed easily.  Psychiatric/Behavioral: Positive for  depression. Negative for decreased concentration, dysphoric mood and suicidal ideas. The patient is not nervous/anxious and does not have insomnia.     Patient Active Problem List   Diagnosis Date Noted  . Insomnia 09/28/2015  . Depression 09/28/2015    Allergies  Allergen Reactions  . Levofloxacin Other (See Comments)  . Zolpidem Other (See Comments)    Past Surgical History:  Procedure Laterality Date  . COLONOSCOPY  2010   cleared for 10 years    Social History   Tobacco Use  . Smoking status: Never Smoker  . Smokeless tobacco: Never Used  Substance Use Topics  . Alcohol use: No  . Drug use: No     Medication list has been reviewed and updated.  Current Meds  Medication Sig  . buPROPion (WELLBUTRIN) 100 MG tablet Take 1 tablet (100 mg total) by mouth 2 (two) times daily.  . traZODone (DESYREL) 100 MG tablet Take 1 tablet (100 mg total) by mouth at bedtime.    PHQ 2/9 Scores 12/28/2017 03/18/2017  PHQ - 2 Score 0 0  PHQ- 9 Score 0 3    Physical Exam Vitals signs and nursing note reviewed.  Constitutional:      General: She is not irritable.She is not in acute distress.    Appearance: She is not diaphoretic.  HENT:     Head: Normocephalic and atraumatic.     Right Ear: External ear normal.     Left Ear: External ear normal.     Nose: Nose normal.  Eyes:     General:        Right eye: No discharge.        Left eye: No discharge.     Conjunctiva/sclera: Conjunctivae normal.     Pupils: Pupils are equal, round, and reactive to light.  Neck:     Musculoskeletal: Normal range of motion and neck supple.     Thyroid: No thyromegaly.     Vascular: No JVD.  Cardiovascular:     Rate and Rhythm: Normal rate and regular rhythm.     Heart sounds: Normal heart sounds. No murmur. No friction rub. No gallop.   Pulmonary:     Effort: Pulmonary effort is normal.     Breath sounds: Normal breath sounds.  Abdominal:     General: Bowel sounds are normal.      Palpations: Abdomen is soft. There is no mass.     Tenderness: There is no abdominal tenderness. There is no guarding.  Musculoskeletal: Normal range of motion.  Lymphadenopathy:     Cervical: No cervical adenopathy.  Skin:    General: Skin is warm and dry.  Neurological:     Mental Status: She is alert.     Deep Tendon Reflexes: Reflexes are normal and symmetric.     BP 138/80   Pulse 76   Ht 5\' 5"  (1.651 m)   Wt 244 lb (110.7 kg)   BMI 40.60 kg/m   Assessment and Plan: 1. Primary insomnia Patient has occasional insomnia for which she takes trazodone 100 mg nightly. - traZODone (DESYREL) 100 MG tablet; Take 1 tablet (100 mg total) by mouth at bedtime.  Dispense: 90 tablet; Refill: 1  2. Recurrent major depressive disorder, in partial remission (HCC) Chronic.  Controlled.  Continue bupropion 100 mg 1 twice daily daily. - buPROPion (WELLBUTRIN) 100 MG tablet; Take 1 tablet (100 mg total) by mouth 2 (two) times daily.  Dispense: 180 tablet; Refill: 1

## 2018-01-06 LAB — FECAL OCCULT BLOOD, IMMUNOCHEMICAL: IFOBT: NEGATIVE

## 2018-01-28 ENCOUNTER — Encounter: Payer: Self-pay | Admitting: Family Medicine

## 2018-01-28 ENCOUNTER — Other Ambulatory Visit (HOSPITAL_COMMUNITY)
Admission: RE | Admit: 2018-01-28 | Discharge: 2018-01-28 | Disposition: A | Discharge status: Home / Self Care | Payer: No Typology Code available for payment source | Source: Ambulatory Visit | Attending: Family Medicine | Admitting: Family Medicine

## 2018-01-28 ENCOUNTER — Ambulatory Visit (INDEPENDENT_AMBULATORY_CARE_PROVIDER_SITE_OTHER): Payer: PRIVATE HEALTH INSURANCE | Admitting: Family Medicine

## 2018-01-28 VITALS — BP 138/92 | HR 72 | Ht 65.0 in | Wt 242.0 lb

## 2018-01-28 DIAGNOSIS — Z124 Encounter for screening for malignant neoplasm of cervix: Secondary | ICD-10-CM | POA: Insufficient documentation

## 2018-01-28 DIAGNOSIS — Z0000 Encounter for general adult medical examination without abnormal findings: Secondary | ICD-10-CM | POA: Diagnosis not present

## 2018-01-28 DIAGNOSIS — Z1211 Encounter for screening for malignant neoplasm of colon: Secondary | ICD-10-CM

## 2018-01-28 DIAGNOSIS — Z6841 Body Mass Index (BMI) 40.0 and over, adult: Secondary | ICD-10-CM

## 2018-01-28 NOTE — Progress Notes (Signed)
Date:  01/28/2018   Name:  Rachel Burton   DOB:  Feb 03, 1955   MRN:  [id number]   Chief Complaint: Annual Exam (breast exam- no mammo and pap)  Patient is a 63 year old female who presents for a comprehensive physical exam. The patient reports the following problems: none. Health maintenance has been reviewed pap/colon screen.   Review of Systems  Constitutional: Negative.  Negative for chills, fatigue, fever and unexpected weight change.  HENT: Negative for congestion, ear discharge, ear pain, rhinorrhea, sinus pressure, sneezing and sore throat.   Eyes: Negative for photophobia, pain, discharge, redness and itching.  Respiratory: Negative for cough, shortness of breath, wheezing and stridor.   Gastrointestinal: Negative for abdominal pain, blood in stool, constipation, diarrhea, nausea and vomiting.  Endocrine: Negative for cold intolerance, heat intolerance, polydipsia, polyphagia and polyuria.  Genitourinary: Negative for dysuria, flank pain, frequency, hematuria, menstrual problem, pelvic pain, urgency, vaginal bleeding and vaginal discharge.  Musculoskeletal: Negative for arthralgias, back pain and myalgias.  Skin: Negative for rash.  Allergic/Immunologic: Negative for environmental allergies and food allergies.  Neurological: Negative for dizziness, weakness, light-headedness, numbness and headaches.  Hematological: Negative for adenopathy. Does not bruise/bleed easily.  Psychiatric/Behavioral: Negative for dysphoric mood. The patient is not nervous/anxious.     Patient Active Problem List   Diagnosis Date Noted  . Insomnia 09/28/2015  . Depression 09/28/2015    Allergies  Allergen Reactions  . Levofloxacin Other (See Comments)  . Zolpidem Other (See Comments)    Past Surgical History:  Procedure Laterality Date  . COLONOSCOPY  2010   cleared for 10 years    Social History   Tobacco Use  . Smoking status: Never Smoker  . Smokeless tobacco: Never Used    Substance Use Topics  . Alcohol use: No  . Drug use: No     Medication list has been reviewed and updated.  Current Meds  Medication Sig  . buPROPion (WELLBUTRIN) 100 MG tablet Take 1 tablet (100 mg total) by mouth 2 (two) times daily.  . traZODone (DESYREL) 100 MG tablet Take 1 tablet (100 mg total) by mouth at bedtime.    PHQ 2/9 Scores 01/28/2018 12/28/2017 03/18/2017  PHQ - 2 Score 0 0 0  PHQ- 9 Score 1 0 3    Physical Exam Vitals signs and nursing note reviewed. Exam conducted with a chaperone present.  Constitutional:      General: She is not in acute distress.    Appearance: Normal appearance. She is not diaphoretic.  HENT:     Head: Normocephalic and atraumatic.     Right Ear: Tympanic membrane, ear canal and external ear normal.     Left Ear: Tympanic membrane, ear canal and external ear normal.     Nose: Nose normal.     Mouth/Throat:     Mouth: Mucous membranes are dry.     Pharynx: Oropharynx is clear. No oropharyngeal exudate or posterior oropharyngeal erythema.  Eyes:     General: No scleral icterus.       Right eye: No discharge.        Left eye: No discharge.     Extraocular Movements: Extraocular movements intact.     Conjunctiva/sclera: Conjunctivae normal.     Pupils: Pupils are equal, round, and reactive to light.  Neck:     Musculoskeletal: Normal range of motion and neck supple. No neck rigidity or muscular tenderness.     Thyroid: No thyromegaly.     Vascular: No  carotid bruit or JVD.  Cardiovascular:     Rate and Rhythm: Normal rate and regular rhythm.     Pulses: Normal pulses.     Heart sounds: Normal heart sounds. No murmur. No friction rub. No gallop.   Pulmonary:     Effort: Pulmonary effort is normal.     Breath sounds: Normal breath sounds. No wheezing, rhonchi or rales.  Chest:     Chest wall: No tenderness. There is no dullness to percussion.     Breasts:        Right: Normal. No swelling, bleeding, inverted nipple, mass, nipple  discharge, skin change or tenderness.        Left: Normal. No swelling, bleeding, inverted nipple, mass, nipple discharge, skin change or tenderness.  Abdominal:     General: Bowel sounds are normal. There is no distension.     Palpations: Abdomen is soft. There is no mass.     Tenderness: There is no abdominal tenderness. There is no right CVA tenderness, left CVA tenderness, guarding or rebound.     Hernia: No hernia is present. There is no hernia in the right inguinal area or left inguinal area.  Genitourinary:    General: Normal vulva.     Exam position: Lithotomy position.     Pubic Area: No rash.      Labia:        Right: No rash.        Left: No rash.      Vagina: Normal. No vaginal discharge.     Cervix: Normal.     Uterus: Normal.      Adnexa: Right adnexa normal.     Rectum: Normal. Guaiac result negative. No mass.     Comments: nulliparous Musculoskeletal: Normal range of motion.        General: No swelling, tenderness, deformity or signs of injury.     Right lower leg: No edema.     Left lower leg: No edema.  Lymphadenopathy:     Cervical: No cervical adenopathy.     Upper Body:     Right upper body: No axillary adenopathy.     Left upper body: No axillary adenopathy.     Lower Body: No right inguinal adenopathy. No left inguinal adenopathy.  Skin:    General: Skin is warm and dry.     Capillary Refill: Capillary refill takes less than 2 seconds.     Coloration: Skin is not jaundiced or pale.     Findings: No bruising, erythema or lesion.  Neurological:     General: No focal deficit present.     Mental Status: She is alert and oriented to person, place, and time.     Cranial Nerves: No cranial nerve deficit.     Sensory: No sensory deficit.     Motor: No weakness.     Coordination: Coordination normal.     Gait: Gait normal.     Deep Tendon Reflexes: Reflexes are normal and symmetric. Reflexes normal.  Psychiatric:        Mood and Affect: Mood normal.         Behavior: Behavior normal.        Thought Content: Thought content normal.     BP (!) 138/92   Pulse 72   Ht 5\' 5"  (1.651 m)   Wt 242 lb (109.8 kg)   BMI 40.27 kg/m   Assessment and Plan: 1. Annual physical exam No subjective objective concerns noted during the history or physical  exam with patient.  Will check renal function panel and lipid panel for baseline. - Renal Function Panel - Lipid panel  2. BMI 40.0-44.9, adult [facility](HCC) Patient has lost 20 pounds most recently with exercise and diet.  Continue with the present regimen.  3. Colon cancer screening Options discussed with patient and she does not want colonoscopy.  But she will undergo a fecal occult immunochemical test for [facility].  Guaiac was noted to be negative this morning. - Fecal occult blood, imunochemical([facility])  4. Cervical cancer screening Underwent Pap smear and bimanual exam with no concerns noted on exam. - Cytology - PAP

## 2018-01-29 LAB — RENAL FUNCTION PANEL
ALBUMIN: 4.1 g/dL (ref 3.8–4.8)
BUN / CREAT RATIO: 23 (ref 12–28)
BUN: 19 mg/dL (ref 8–27)
CO2: 26 mmol/L (ref 20–29)
Calcium: 9.3 mg/dL (ref 8.7–10.3)
Chloride: 101 mmol/L (ref 96–106)
Creatinine, Ser: 0.84 mg/dL (ref 0.57–1.00)
GFR calc non Af Amer: 74 mL/min/{1.73_m2} (ref >59)
GFR, EST AFRICAN AMERICAN: 85 mL/min/{1.73_m2} (ref >59)
GLUCOSE: 88 mg/dL (ref 65–99)
Phosphorus: 3.1 mg/dL (ref 3.0–4.3)
Potassium: 4.5 mmol/L (ref 3.5–5.2)
SODIUM: 142 mmol/L (ref 134–144)

## 2018-01-29 LAB — LIPID PANEL
Chol/HDL Ratio: 2.7 ratio (ref 0.0–4.4)
Cholesterol, Total: 204 mg/dL — ABNORMAL HIGH (ref 100–199)
HDL: 75 mg/dL (ref >39)
LDL CALC: 119 mg/dL — AB (ref 0–99)
TRIGLYCERIDES: 48 mg/dL (ref 0–149)
VLDL CHOLESTEROL CAL: 10 mg/dL (ref 5–40)

## 2018-02-02 LAB — CYTOLOGY - PAP
Diagnosis: NEGATIVE
HPV (WINDOPATH): NOT DETECTED

## 2018-02-08 LAB — FECAL OCCULT BLOOD, IMMUNOCHEMICAL: Fecal Occult Bld: NEGATIVE

## 2018-07-15 ENCOUNTER — Encounter: Payer: Self-pay | Admitting: Family Medicine

## 2018-07-15 ENCOUNTER — Other Ambulatory Visit: Payer: Self-pay

## 2018-07-15 ENCOUNTER — Ambulatory Visit (INDEPENDENT_AMBULATORY_CARE_PROVIDER_SITE_OTHER): Payer: Medicare Other | Admitting: Family Medicine

## 2018-07-15 VITALS — BP 130/84 | HR 84 | Ht 65.0 in | Wt 223.0 lb

## 2018-07-15 DIAGNOSIS — R238 Other skin changes: Secondary | ICD-10-CM | POA: Diagnosis not present

## 2018-07-15 DIAGNOSIS — F3341 Major depressive disorder, recurrent, in partial remission: Secondary | ICD-10-CM

## 2018-07-15 DIAGNOSIS — F5101 Primary insomnia: Secondary | ICD-10-CM | POA: Diagnosis not present

## 2018-07-15 MED ORDER — TRAZODONE HCL 100 MG PO TABS: 100.0000 mg | ORAL_TABLET | Freq: Every day | ORAL | 1 refills | Status: DC

## 2018-07-15 MED ORDER — BUPROPION HCL 100 MG PO TABS: 100.0000 mg | ORAL_TABLET | Freq: Two times a day (BID) | ORAL | 1 refills | Status: DC

## 2018-07-15 NOTE — Progress Notes (Signed)
Date:  07/15/2018   Name:  Rachel Burton   DOB:  06-18-1955   MRN:  [id number]   Chief Complaint: Depression (PHQ9=0 and GAD7=0) and toe blister (2nd digit on R) foot. lost toenail then blister came up)  Depression        The patient presents with no depression.  This is a chronic problem.  The current episode started more than 1 year ago.   The onset quality is sudden.   The problem occurs intermittently.  The problem has been gradually improving since onset.  Associated symptoms include no decreased concentration, no fatigue, no helplessness, no hopelessness, does not have insomnia, not irritable, no restlessness, no decreased interest, no appetite change, no body aches, no myalgias, no headaches, no indigestion, not sad and no suicidal ideas.     The symptoms are aggravated by medication.  Past treatments include other medications (bupropion).  Compliance with treatment is good.  Previous treatment provided moderate relief.   Pertinent negatives include no chronic fatigue syndrome, no chronic pain, no physical disability, no terminal illness and no depression. Insomnia Primary symptoms: no fragmented sleep, no sleep disturbance, no difficulty falling asleep, no somnolence, no frequent awakening, no premature morning awakening, no malaise/fatigue.  The problem has been gradually improving since onset. PMH includes: depression.    Review of Systems  Constitutional: Negative for appetite change, chills, fatigue, fever and malaise/fatigue.  HENT: Negative for drooling, ear discharge, ear pain and sore throat.   Respiratory: Negative for cough, shortness of breath and wheezing.   Cardiovascular: Negative for chest pain, palpitations and leg swelling.  Gastrointestinal: Negative for abdominal pain, blood in stool, constipation, diarrhea and nausea.  Endocrine: Negative for polydipsia.  Genitourinary: Negative for dysuria, frequency, hematuria and urgency.  Musculoskeletal: Negative for back  pain, myalgias and neck pain.  Skin: Negative for rash.  Allergic/Immunologic: Negative for environmental allergies.  Neurological: Negative for dizziness and headaches.  Hematological: Does not bruise/bleed easily.  Psychiatric/Behavioral: Positive for depression. Negative for decreased concentration, sleep disturbance and suicidal ideas. The patient is not nervous/anxious and does not have insomnia.     Patient Active Problem List   Diagnosis Date Noted  . Insomnia 09/28/2015  . Depression 09/28/2015    Allergies  Allergen Reactions  . Levofloxacin Other (See Comments)  . Zolpidem Other (See Comments)    Past Surgical History:  Procedure Laterality Date  . COLONOSCOPY  2010   cleared for 10 years    Social History   Tobacco Use  . Smoking status: Never Smoker  . Smokeless tobacco: Never Used  Substance Use Topics  . Alcohol use: No  . Drug use: No     Medication list has been reviewed and updated.  Current Meds  Medication Sig  . buPROPion (WELLBUTRIN) 100 MG tablet Take 1 tablet (100 mg total) by mouth 2 (two) times daily.  . traZODone (DESYREL) 100 MG tablet Take 1 tablet (100 mg total) by mouth at bedtime.    PHQ 2/9 Scores 07/15/2018 01/28/2018 12/28/2017 03/18/2017  PHQ - 2 Score 0 0 0 0  PHQ- 9 Score 0 1 0 3    BP Readings from Last 3 Encounters:  07/15/18 130/84  01/28/18 (!) 138/92  12/28/17 138/80    Physical Exam Vitals signs and nursing note reviewed.  Constitutional:      General: She is not irritable.    Appearance: She is well-developed.  HENT:     Head: Normocephalic.     Right Ear:  Tympanic membrane, ear canal and external ear normal.     Left Ear: Tympanic membrane, ear canal and external ear normal.     Nose: Nose normal.  Eyes:     General: Lids are everted, no foreign bodies appreciated. No scleral icterus.       Left eye: No foreign body or hordeolum.     Conjunctiva/sclera: Conjunctivae normal.     Right eye: Right conjunctiva  is not injected.     Left eye: Left conjunctiva is not injected.     Pupils: Pupils are equal, round, and reactive to light.  Neck:     Musculoskeletal: Normal range of motion and neck supple.     Thyroid: No thyromegaly.     Vascular: No JVD.     Trachea: No tracheal deviation.  Cardiovascular:     Rate and Rhythm: Normal rate and regular rhythm.     Heart sounds: Normal heart sounds. No murmur. No friction rub. No gallop.   Pulmonary:     Effort: Pulmonary effort is normal. No respiratory distress.     Breath sounds: Normal breath sounds. No wheezing or rales.  Abdominal:     General: Bowel sounds are normal.     Palpations: Abdomen is soft. There is no mass.     Tenderness: There is no abdominal tenderness. There is no guarding or rebound.  Musculoskeletal: Normal range of motion.        General: No tenderness.  Lymphadenopathy:     Cervical: No cervical adenopathy.  Skin:    General: Skin is warm.     Findings: No rash.     Comments: Vesicle/bulla second toe right foot  Neurological:     Mental Status: She is alert and oriented to person, place, and time.     Cranial Nerves: No cranial nerve deficit.     Deep Tendon Reflexes: Reflexes normal.  Psychiatric:        Mood and Affect: Mood is not anxious or depressed.     Wt Readings from Last 3 Encounters:  07/15/18 223 lb (101.2 kg)  01/28/18 242 lb (109.8 kg)  12/28/17 244 lb (110.7 kg)    BP 130/84   Pulse 84   Ht 5\' 5"  (1.651 m)   Wt 223 lb (101.2 kg)   BMI 37.11 kg/m   Assessment and Plan: 1. Primary insomnia Chronic.  Controlled.  Continue trazodone 100 mg tablet nightly. - traZODone (DESYREL) 100 MG tablet; Take 1 tablet (100 mg total) by mouth at bedtime.  Dispense: 90 tablet; Refill: 1  2. Recurrent major depressive disorder, in partial remission (HCC) PHQ 0 on today's visit.  Continue bupropion 100 mg 1 twice a day. - buPROPion (WELLBUTRIN) 100 MG tablet; Take 1 tablet (100 mg total) by mouth 2 (two)  times daily.  Dispense: 180 tablet; Refill: 1  3. Skin bulla There is a little skin ball of this on the second toe of the right foot.  This has involve the nail bed and appears to have separated the nail.  Patient denies any trauma and this may be secondary to a bite of some type.  Patient was instructed to keep the area clean may be cover area to prevent striction and rupture if there is rupture to clean with an antibacterial soap.  We have put in a referral for evaluation by dermatology if it should continue. - Ambulatory referral to Dermatology

## 2018-08-23 ENCOUNTER — Encounter: Payer: Self-pay | Admitting: Family Medicine

## 2018-09-14 DIAGNOSIS — H2513 Age-related nuclear cataract, bilateral: Secondary | ICD-10-CM | POA: Diagnosis not present

## 2019-01-18 ENCOUNTER — Encounter: Payer: Self-pay | Admitting: Family Medicine

## 2019-01-18 ENCOUNTER — Other Ambulatory Visit: Payer: Self-pay

## 2019-01-18 DIAGNOSIS — F3341 Major depressive disorder, recurrent, in partial remission: Secondary | ICD-10-CM

## 2019-01-18 DIAGNOSIS — F5101 Primary insomnia: Secondary | ICD-10-CM

## 2019-01-18 MED ORDER — BUPROPION HCL 100 MG PO TABS: 100.0000 mg | ORAL_TABLET | Freq: Two times a day (BID) | ORAL | 0 refills | Status: DC

## 2019-01-18 MED ORDER — TRAZODONE HCL 100 MG PO TABS: 100.0000 mg | ORAL_TABLET | Freq: Every day | ORAL | 0 refills | Status: DC

## 2019-01-18 NOTE — Progress Notes (Unsigned)
Sent in 30 days meds to [facility]

## 2019-02-02 ENCOUNTER — Encounter: Payer: Self-pay | Admitting: Family Medicine

## 2019-02-02 ENCOUNTER — Other Ambulatory Visit: Payer: Self-pay

## 2019-02-02 ENCOUNTER — Ambulatory Visit (INDEPENDENT_AMBULATORY_CARE_PROVIDER_SITE_OTHER): Payer: Medicare Other | Admitting: Family Medicine

## 2019-02-02 VITALS — BP 130/88 | HR 72 | Ht 65.0 in | Wt 212.0 lb

## 2019-02-02 DIAGNOSIS — E782 Mixed hyperlipidemia: Secondary | ICD-10-CM | POA: Diagnosis not present

## 2019-02-02 DIAGNOSIS — Z1231 Encounter for screening mammogram for malignant neoplasm of breast: Secondary | ICD-10-CM | POA: Diagnosis not present

## 2019-02-02 DIAGNOSIS — F5101 Primary insomnia: Secondary | ICD-10-CM | POA: Diagnosis not present

## 2019-02-02 DIAGNOSIS — Z780 Asymptomatic menopausal state: Secondary | ICD-10-CM | POA: Diagnosis not present

## 2019-02-02 DIAGNOSIS — Z0000 Encounter for general adult medical examination without abnormal findings: Secondary | ICD-10-CM | POA: Diagnosis not present

## 2019-02-02 DIAGNOSIS — Z1211 Encounter for screening for malignant neoplasm of colon: Secondary | ICD-10-CM | POA: Diagnosis not present

## 2019-02-02 DIAGNOSIS — F3341 Major depressive disorder, recurrent, in partial remission: Secondary | ICD-10-CM

## 2019-02-02 DIAGNOSIS — R69 Illness, unspecified: Secondary | ICD-10-CM

## 2019-02-02 MED ORDER — BUPROPION HCL 100 MG PO TABS: 100.0000 mg | ORAL_TABLET | Freq: Two times a day (BID) | ORAL | 1 refills | Status: DC

## 2019-02-02 MED ORDER — TRAZODONE HCL 100 MG PO TABS: 100.0000 mg | ORAL_TABLET | Freq: Every day | ORAL | 1 refills | Status: DC

## 2019-02-02 NOTE — Progress Notes (Signed)
Date:  02/02/2019   Name:  Rachel Burton   DOB:  February 12, 1955   MRN:  [id number]   Chief Complaint: Annual Exam (no pap, needs mammo)  Patient is a 64 year old female who presents for a comprehensive physical exam. The patient reports the following problems: none. Health maintenance has been reviewed colon.  Insomnia Primary symptoms: difficulty falling asleep.  The current episode started more than one year. The onset quality is gradual. The problem occurs nightly. Progression since onset: controlled. Exacerbated by: stress. The treatment provided moderate relief. PMH includes: depression.  Depression        This is a chronic problem.  The current episode started more than 1 year ago.   The onset quality is gradual.   The problem occurs constantly.  The problem has been gradually improving since onset.  Associated symptoms include insomnia.  Associated symptoms include no decreased concentration, no fatigue, no helplessness, no hopelessness, not irritable, no restlessness, no decreased interest, no appetite change, no body aches, no myalgias, no headaches, no indigestion, not sad and no suicidal ideas.  Past treatments include other medications (welbutrin).   Lab Results  Component Value Date   CREATININE 0.84 01/28/2018   BUN 19 01/28/2018   NA 142 01/28/2018   K 4.5 01/28/2018   CL 101 01/28/2018   CO2 26 01/28/2018   Lab Results  Component Value Date   CHOL 204 (H) 01/28/2018   HDL 75 01/28/2018   LDLCALC 119 (H) 01/28/2018   TRIG 48 01/28/2018   CHOLHDL 2.7 01/28/2018   No results found for: TSH No results found for: HGBA1C   Review of Systems  Constitutional: Negative for appetite change and fatigue.  Musculoskeletal: Negative for myalgias.  Neurological: Negative for headaches.  Psychiatric/Behavioral: Positive for depression. Negative for decreased concentration and suicidal ideas. The patient has insomnia.     Patient Active Problem List   Diagnosis Date Noted    . Insomnia 09/28/2015  . Depression 09/28/2015    Allergies  Allergen Reactions  . Levofloxacin Other (See Comments)  . Zolpidem Other (See Comments)    Past Surgical History:  Procedure Laterality Date  . COLONOSCOPY  2010   cleared for 10 years    Social History   Tobacco Use  . Smoking status: Never Smoker  . Smokeless tobacco: Never Used  Substance Use Topics  . Alcohol use: No  . Drug use: No     Medication list has been reviewed and updated.  Current Meds  Medication Sig  . buPROPion (WELLBUTRIN) 100 MG tablet Take 1 tablet (100 mg total) by mouth 2 (two) times daily.  . traZODone (DESYREL) 100 MG tablet Take 1 tablet (100 mg total) by mouth at bedtime.    PHQ 2/9 Scores 02/02/2019 07/15/2018 01/28/2018 12/28/2017  PHQ - 2 Score 1 0 0 0  PHQ- 9 Score 1 0 1 0    BP Readings from Last 3 Encounters:  02/02/19 130/88  07/15/18 130/84  01/28/18 (!) 138/92    Physical Exam Vitals and nursing note reviewed.  Constitutional:      General: She is not irritable.She is not in acute distress.    Appearance: Normal appearance. She is well-groomed. She is obese. She is not diaphoretic.  HENT:     Head: Normocephalic and atraumatic.     Jaw: There is normal jaw occlusion.     Right Ear: Hearing, tympanic membrane, ear canal and external ear normal.     Left Ear:  Hearing, tympanic membrane, ear canal and external ear normal.     Nose: Nose normal.     Mouth/Throat:     Lips: Pink.     Mouth: Mucous membranes are moist.  Eyes:     General:        Right eye: No discharge.        Left eye: No discharge.     Conjunctiva/sclera: Conjunctivae normal.     Pupils: Pupils are equal, round, and reactive to light.  Neck:     Thyroid: No thyromegaly.     Vascular: No JVD.  Cardiovascular:     Rate and Rhythm: Normal rate and regular rhythm.     Heart sounds: Normal heart sounds. No murmur. No friction rub. No gallop.   Pulmonary:     Effort: Pulmonary effort is  normal.     Breath sounds: Normal breath sounds.  Abdominal:     General: Bowel sounds are normal.     Palpations: Abdomen is soft. There is no mass.     Tenderness: There is no abdominal tenderness. There is no guarding.  Musculoskeletal:        General: Normal range of motion.     Cervical back: Normal range of motion and neck supple.  Lymphadenopathy:     Cervical: No cervical adenopathy.  Skin:    General: Skin is warm and dry.  Neurological:     Mental Status: She is alert.     Deep Tendon Reflexes: Reflexes are normal and symmetric.  Psychiatric:        Behavior: Behavior is cooperative.     Wt Readings from Last 3 Encounters:  02/02/19 212 lb (96.2 kg)  07/15/18 223 lb (101.2 kg)  01/28/18 242 lb (109.8 kg)    BP 130/88   Pulse 72   Ht 5\' 5"  (1.651 m)   Wt 212 lb (96.2 kg)   BMI 35.28 kg/m   Assessment and Plan: 1. Annual physical exam No subjective/objective concerns noted during the history and physical exam.  Patient's previous encounters, most recent labs, recent imaging, and any care elsewhere was noted.  Patient relates no concerns at this time other than noted below and refills of medications.Rachel Burton is a 64 y.o. female who presents today for her Complete Annual Exam. She feels well. She reports exercising occasional. She reports she is sleeping well. Immunizations are reviewed and recommendations provided.   Age appropriate screening tests are discussed. Counseling given for risk factor reduction interventions.  As noted below labs were done Renal function panel and lipid panel. 2. Breast cancer screening by mammogram This was discussed with patient breast exam was noted to have no evidence of any palpable mass.  We will initiate 3D breast screening bilateral. - MM 3D SCREEN BREAST BILATERAL; Future  3. Post-menopausal Patient is postmenopausal and in health maintenance has determined that we will do a bone density in the meantime patient will be  suggested to take vitamin D and calcium. - DG Bone Density; Future  4. Primary insomnia Chronic.  Controlled.  Stable.  Continue trazodone 100 mg 1 tablet nightly. - traZODone (DESYREL) 100 MG tablet; Take 1 tablet (100 mg total) by mouth at bedtime.  Dispense: 90 tablet; Refill: 1  5. Recurrent major depressive disorder, in partial remission (HCC) Chronic.  Controlled.  Stable.  Patient's PHQ is 0.  Patient definitely wants to continue her bupropion 100 mg twice a day - buPROPion (WELLBUTRIN) 100 MG tablet; Take 1 tablet (100 mg  total) by mouth 2 (two) times daily.  Dispense: 180 tablet; Refill: 1  6. Taking medication for chronic disease Patient is taking medications which may have concerns for GFR and we will check this with renal function panel. - Renal Function Panel  7. Colon cancer screening Discussed and patient has consented to have FIT testing per protocol from last year. - Fecal occult blood, imunochemical([facility])

## 2019-02-03 LAB — RENAL FUNCTION PANEL
Albumin: 4.4 g/dL (ref 3.8–4.8)
BUN/Creatinine Ratio: 25 (ref 12–28)
BUN: 22 mg/dL (ref 8–27)
CO2: 23 mmol/L (ref 20–29)
Calcium: 9.7 mg/dL (ref 8.7–10.3)
Chloride: 101 mmol/L (ref 96–106)
Creatinine, Ser: 0.87 mg/dL (ref 0.57–1.00)
GFR calc Af Amer: 81 mL/min/{1.73_m2} (ref >59)
GFR calc non Af Amer: 70 mL/min/{1.73_m2} (ref >59)
Glucose: 87 mg/dL (ref 65–99)
Phosphorus: 3.6 mg/dL (ref 3.0–4.3)
Potassium: 4.6 mmol/L (ref 3.5–5.2)
Sodium: 140 mmol/L (ref 134–144)

## 2019-02-08 LAB — LIPID PANEL WITH LDL/HDL RATIO
Cholesterol, Total: 256 mg/dL — ABNORMAL HIGH (ref 100–199)
HDL: 98 mg/dL (ref >39)
LDL Chol Calc (NIH): 151 mg/dL — ABNORMAL HIGH (ref 0–99)
LDL/HDL Ratio: 1.5 ratio (ref 0.0–3.2)
Triglycerides: 48 mg/dL (ref 0–149)
VLDL Cholesterol Cal: 7 mg/dL (ref 5–40)

## 2019-02-08 LAB — SPECIMEN STATUS REPORT

## 2019-02-08 LAB — FECAL OCCULT BLOOD, IMMUNOCHEMICAL: Fecal Occult Bld: NEGATIVE

## 2019-02-09 ENCOUNTER — Ambulatory Visit
Admission: RE | Admit: 2019-02-09 | Discharge: 2019-02-09 | Disposition: A | Discharge status: Home / Self Care | Payer: Medicare Other | Source: Ambulatory Visit | Attending: Family Medicine | Admitting: Family Medicine

## 2019-02-09 ENCOUNTER — Other Ambulatory Visit: Payer: Self-pay

## 2019-02-09 DIAGNOSIS — Z1231 Encounter for screening mammogram for malignant neoplasm of breast: Secondary | ICD-10-CM | POA: Insufficient documentation

## 2019-02-09 DIAGNOSIS — Z780 Asymptomatic menopausal state: Secondary | ICD-10-CM | POA: Diagnosis not present

## 2019-02-09 DIAGNOSIS — Z13820 Encounter for screening for osteoporosis: Secondary | ICD-10-CM | POA: Insufficient documentation

## 2019-02-16 ENCOUNTER — Encounter: Payer: Self-pay | Admitting: Family Medicine

## 2019-02-17 ENCOUNTER — Ambulatory Visit: Payer: Medicare Other | Attending: Internal Medicine

## 2019-02-17 DIAGNOSIS — Z23 Encounter for immunization: Secondary | ICD-10-CM

## 2019-02-17 NOTE — Progress Notes (Signed)
   Covid-19 Vaccination Clinic  Name:  Alfhild Partch    MRN: [id number] DOB: 1955/08/15  02/17/2019  Ms. Tenorio was observed post Covid-19 immunization for 15 minutes without incidence. She was provided with Vaccine Information Sheet and instruction to access the V-Safe system.   Ms. Done was instructed to call 911 with any severe reactions post vaccine: Marland Kitchen Difficulty breathing  . Swelling of your face and throat  . A fast heartbeat  . A bad rash all over your body  . Dizziness and weakness    Immunizations Administered    Name Date Dose VIS Date Route   Pfizer COVID-19 Vaccine 02/17/2019 10:33 AM 0.3 mL 12/17/2018 Intramuscular   Manufacturer: [employer], [employer]   Lot: [id number]   NDC: [address]-4

## 2019-03-15 ENCOUNTER — Ambulatory Visit: Payer: Medicare Other | Attending: Internal Medicine

## 2019-03-15 DIAGNOSIS — Z23 Encounter for immunization: Secondary | ICD-10-CM | POA: Insufficient documentation

## 2019-03-15 NOTE — Progress Notes (Signed)
   Covid-19 Vaccination Clinic  Name:  Rachel Burton    MRN: [id number] DOB: 09/21/1955  03/15/2019  [name] was observed post Covid-19 immunization for 15 minutes without incident. She was provided with Vaccine Information Sheet and instruction to access the V-Safe system.   [name] was instructed to call 911 with any severe reactions post vaccine: Marland Kitchen Difficulty breathing  . Swelling of face and throat  . A fast heartbeat  . A bad rash all over body  . Dizziness and weakness   Immunizations Administered    Name Date Dose VIS Date Route   Pfizer COVID-19 Vaccine 03/15/2019 10:30 AM 0.3 mL 12/17/2018 Intramuscular   Manufacturer: [employer], [employer]   Lot: [id number]   NDC: [address]-3

## 2019-07-20 ENCOUNTER — Ambulatory Visit (INDEPENDENT_AMBULATORY_CARE_PROVIDER_SITE_OTHER): Payer: Medicare Other | Admitting: Family Medicine

## 2019-07-20 ENCOUNTER — Encounter: Payer: Self-pay | Admitting: Family Medicine

## 2019-07-20 ENCOUNTER — Other Ambulatory Visit: Payer: Self-pay

## 2019-07-20 DIAGNOSIS — F5101 Primary insomnia: Secondary | ICD-10-CM | POA: Diagnosis not present

## 2019-07-20 DIAGNOSIS — F3341 Major depressive disorder, recurrent, in partial remission: Secondary | ICD-10-CM

## 2019-07-20 MED ORDER — TRAZODONE HCL 100 MG PO TABS: 100.0000 mg | ORAL_TABLET | Freq: Every day | ORAL | 1 refills | Status: DC

## 2019-07-20 MED ORDER — BUPROPION HCL 100 MG PO TABS: 100.0000 mg | ORAL_TABLET | Freq: Two times a day (BID) | ORAL | 1 refills | Status: DC

## 2019-07-20 NOTE — Progress Notes (Signed)
Date:  07/20/2019   Name:  Rachel Burton   DOB:  1955/10/14   MRN:  [id number]   Chief Complaint: Depression and Insomnia  Depression        This is a chronic problem.  The current episode started more than 1 year ago.   The onset quality is gradual.   The problem has been gradually improving since onset.  Associated symptoms include insomnia.  Associated symptoms include no decreased concentration, no fatigue, no helplessness, no hopelessness, not irritable, no restlessness, no decreased interest, no appetite change, no body aches, no myalgias, no headaches, no indigestion, not sad and no suicidal ideas.     The symptoms are aggravated by nothing.  Past treatments include other medications (bupropion).  Compliance with treatment is good.  Previous treatment provided mild relief. Insomnia Primary symptoms: no fragmented sleep, no difficulty falling asleep, no frequent awakening, no premature morning awakening, no malaise/fatigue.  The current episode started more than one year. The problem has been gradually improving since onset. PMH includes: depression.    Lab Results  Component Value Date   CREATININE 0.87 02/02/2019   BUN 22 02/02/2019   NA 140 02/02/2019   K 4.6 02/02/2019   CL 101 02/02/2019   CO2 23 02/02/2019   Lab Results  Component Value Date   CHOL 256 (H) 02/02/2019   HDL 98 02/02/2019   LDLCALC 151 (H) 02/02/2019   TRIG 48 02/02/2019   CHOLHDL 2.7 01/28/2018   No results found for: TSH No results found for: HGBA1C No results found for: WBC, HGB, HCT, MCV, PLT No results found for: ALT, AST, GGT, ALKPHOS, BILITOT   Review of Systems  Constitutional: Negative.  Negative for appetite change, chills, fatigue, fever, malaise/fatigue and unexpected weight change.  HENT: Negative for congestion, ear discharge, ear pain, rhinorrhea, sinus pressure, sneezing and sore throat.   Eyes: Negative for photophobia, pain, discharge, redness and itching.  Respiratory:  Negative for cough, shortness of breath, wheezing and stridor.   Cardiovascular: Negative for chest pain, palpitations and leg swelling.  Gastrointestinal: Negative for abdominal pain, blood in stool, constipation, diarrhea, nausea and vomiting.  Endocrine: Negative for cold intolerance, heat intolerance, polydipsia, polyphagia and polyuria.  Genitourinary: Negative for dysuria, flank pain, frequency, hematuria, menstrual problem, pelvic pain, urgency, vaginal bleeding and vaginal discharge.  Musculoskeletal: Negative for arthralgias, back pain and myalgias.  Skin: Negative for rash.  Allergic/Immunologic: Negative for environmental allergies and food allergies.  Neurological: Negative for dizziness, weakness, light-headedness, numbness and headaches.  Hematological: Negative for adenopathy. Does not bruise/bleed easily.  Psychiatric/Behavioral: Positive for depression. Negative for decreased concentration, dysphoric mood and suicidal ideas. The patient has insomnia. The patient is not nervous/anxious.     Patient Active Problem List   Diagnosis Date Noted  . Post-menopausal 02/02/2019  . Insomnia 09/28/2015  . Depression 09/28/2015    Allergies  Allergen Reactions  . Levofloxacin Other (See Comments)  . Zolpidem Other (See Comments)    Past Surgical History:  Procedure Laterality Date  . COLONOSCOPY  2010   cleared for 10 years    Social History   Tobacco Use  . Smoking status: Never Smoker  . Smokeless tobacco: Never Used  Substance Use Topics  . Alcohol use: No  . Drug use: No     Medication list has been reviewed and updated.  Current Meds  Medication Sig  . buPROPion (WELLBUTRIN) 100 MG tablet Take 1 tablet (100 mg total) by mouth 2 (two) times  daily.  . traZODone (DESYREL) 100 MG tablet Take 1 tablet (100 mg total) by mouth at bedtime.    PHQ 2/9 Scores 07/20/2019 02/02/2019 07/15/2018 01/28/2018  PHQ - 2 Score 0 1 0 0  PHQ- 9 Score 0 1 0 1    GAD 7 :  Generalized Anxiety Score 07/20/2019 02/02/2019 07/15/2018  Nervous, Anxious, on Edge 0 0 0  Control/stop worrying 0 0 0  Worry too much - different things 0 0 0  Trouble relaxing 0 0 0  Restless 0 0 0  Easily annoyed or irritable 0 0 0  Afraid - awful might happen 0 0 0  Total GAD 7 Score 0 0 0    BP Readings from Last 3 Encounters:  07/20/19 120/82  02/02/19 130/88  07/15/18 130/84    Physical Exam Vitals and nursing note reviewed.  Constitutional:      General: She is not irritable.She is not in acute distress.    Appearance: She is not diaphoretic.  HENT:     Head: Normocephalic and atraumatic.     Right Ear: Tympanic membrane, ear canal and external ear normal. There is no impacted cerumen.     Left Ear: Tympanic membrane, ear canal and external ear normal. There is no impacted cerumen.     Nose: Nose normal. No congestion or rhinorrhea.     Mouth/Throat:     Mouth: Mucous membranes are moist.  Eyes:     General:        Right eye: No discharge.        Left eye: No discharge.     Conjunctiva/sclera: Conjunctivae normal.     Pupils: Pupils are equal, round, and reactive to light.  Neck:     Thyroid: No thyromegaly.     Vascular: No JVD.  Cardiovascular:     Rate and Rhythm: Normal rate and regular rhythm.     Heart sounds: Normal heart sounds. No murmur heard.  No friction rub. No gallop.   Pulmonary:     Effort: Pulmonary effort is normal.     Breath sounds: Normal breath sounds. No wheezing or rhonchi.  Abdominal:     General: Bowel sounds are normal.     Palpations: Abdomen is soft. There is no mass.     Tenderness: There is no abdominal tenderness. There is no guarding or rebound.  Musculoskeletal:        General: Normal range of motion.     Cervical back: Normal range of motion and neck supple.  Lymphadenopathy:     Cervical: No cervical adenopathy.  Skin:    General: Skin is warm and dry.     Capillary Refill: Capillary refill takes less than 2 seconds.    Neurological:     General: No focal deficit present.     Mental Status: She is alert.     Deep Tendon Reflexes: Reflexes are normal and symmetric.     Wt Readings from Last 3 Encounters:  07/20/19 219 lb (99.3 kg)  02/02/19 212 lb (96.2 kg)  07/15/18 223 lb (101.2 kg)    BP 120/82   Pulse 68   Ht 5\' 5"  (1.651 m)   Wt 219 lb (99.3 kg)   BMI 36.44 kg/m   Assessment and Plan:  1. Primary insomnia Chronic.  Controlled.  Stable.  Uncomplicated.  Patient doing well on trazodone 100 mg nightly and is tolerating well - traZODone (DESYREL) 100 MG tablet; Take 1 tablet (100 mg total) by mouth at  bedtime.  Dispense: 90 tablet; Refill: 1  2. Recurrent major depressive disorder, in partial remission (HCC) Chronic.  Controlled.  Stable.  PHQ 0.  Continue bupropion 100 mg twice a day.  Will recheck in 6 months. - buPROPion (WELLBUTRIN) 100 MG tablet; Take 1 tablet (100 mg total) by mouth 2 (two) times daily.  Dispense: 180 tablet; Refill: 1

## 2019-08-11 DIAGNOSIS — S90821A Blister (nonthermal), right foot, initial encounter: Secondary | ICD-10-CM | POA: Diagnosis not present

## 2019-08-11 DIAGNOSIS — M722 Plantar fascial fibromatosis: Secondary | ICD-10-CM | POA: Diagnosis not present

## 2019-08-11 DIAGNOSIS — M79672 Pain in left foot: Secondary | ICD-10-CM | POA: Diagnosis not present

## 2019-08-11 DIAGNOSIS — M79671 Pain in right foot: Secondary | ICD-10-CM | POA: Diagnosis not present

## 2019-08-11 DIAGNOSIS — M216X1 Other acquired deformities of right foot: Secondary | ICD-10-CM | POA: Diagnosis not present

## 2019-09-08 DIAGNOSIS — M79671 Pain in right foot: Secondary | ICD-10-CM | POA: Diagnosis not present

## 2019-09-08 DIAGNOSIS — S90821D Blister (nonthermal), right foot, subsequent encounter: Secondary | ICD-10-CM | POA: Diagnosis not present

## 2019-09-08 DIAGNOSIS — S90822D Blister (nonthermal), left foot, subsequent encounter: Secondary | ICD-10-CM | POA: Diagnosis not present

## 2019-09-08 DIAGNOSIS — M722 Plantar fascial fibromatosis: Secondary | ICD-10-CM | POA: Diagnosis not present

## 2019-09-08 DIAGNOSIS — M216X1 Other acquired deformities of right foot: Secondary | ICD-10-CM | POA: Diagnosis not present

## 2019-09-16 DIAGNOSIS — H2513 Age-related nuclear cataract, bilateral: Secondary | ICD-10-CM | POA: Diagnosis not present

## 2019-09-20 ENCOUNTER — Telehealth: Payer: Self-pay | Admitting: Family Medicine

## 2019-09-20 NOTE — Telephone Encounter (Signed)
Left message for patient to call back and schedule Medicare Annual Wellness Visit (AWV) either virtually/audio only or in office. Whichever the patients preference is.  No history of AWV; please schedule at anytime with [facility] Health Advisor.  This should be a 40 minute visit  AWV-I PER PALMETTO AS OF 04/07/19

## 2019-09-28 ENCOUNTER — Ambulatory Visit (INDEPENDENT_AMBULATORY_CARE_PROVIDER_SITE_OTHER): Payer: Medicare Other

## 2019-09-28 DIAGNOSIS — Z0000 Encounter for general adult medical examination without abnormal findings: Secondary | ICD-10-CM

## 2019-09-28 NOTE — Progress Notes (Signed)
Subjective:   Rachel Burton is a 64 y.o. female who presents for an Initial Medicare Annual Wellness Visit.  Virtual Visit via Telephone Note  I connected with  Rachel Burton on 09/28/19 at  1:20 PM EDT by telephone and verified that I am speaking with the correct person using two identifiers.  Medicare Annual Wellness visit completed telephonically due to Covid-19 pandemic.   Location: Patient: home Provider: office   I discussed the limitations, risks, security and privacy concerns of performing an evaluation and management service by telephone and the availability of in person appointments. The patient expressed understanding and agreed to proceed.  Unable to perform video visit due to video visit attempted and failed and/or patient does not have video capability.   Some vital signs may be absent or patient reported.   Rachel LittlerKasey Zanasia Hickson, LPN    Review of Systems     Cardiac Risk Factors include: advanced age (>[id number], 54>65 women)     Objective:    There were no vitals filed for this visit. There is no height or weight on file to calculate BMI.  Advanced Directives 09/28/2019  Does Patient Have a Medical Advance Directive? Yes  Does patient want to make changes to medical advance directive? Yes (MAU/Ambulatory/Procedural Areas - Information given)    Current Medications (verified) Outpatient Encounter Medications as of 09/28/2019  Medication Sig  . buPROPion (WELLBUTRIN) 100 MG tablet Take 1 tablet (100 mg total) by mouth 2 (two) times daily.  . traZODone (DESYREL) 100 MG tablet Take 1 tablet (100 mg total) by mouth at bedtime.   No facility-administered encounter medications on file as of 09/28/2019.    Allergies (verified) Levofloxacin and Zolpidem   History: Past Medical History:  Diagnosis Date  . Allergy   . Depression   . Insomnia    Past Surgical History:  Procedure Laterality Date  . COLONOSCOPY  2010   cleared for 10 years   Family History  Problem  Relation Age of Onset  . Breast cancer Mother [id number]  . Cancer Mother   . Heart disease Maternal Grandmother   . Hypertension Maternal Grandmother   . Heart disease Maternal Grandfather    Social History   Socioeconomic History  . Marital status: Widowed    Spouse name: Not on file  . Number of children: 0  . Years of education: Not on file  . Highest education level: Not on file  Occupational History  . Not on file  Tobacco Use  . Smoking status: Never Smoker  . Smokeless tobacco: Never Used  Vaping Use  . Vaping Use: Never used  Substance and Sexual Activity  . Alcohol use: No  . Drug use: No  . Sexual activity: Not Currently    Birth control/protection: Post-menopausal  Other Topics Concern  . Not on file  Social History Narrative  . Not on file   Social Determinants of Health   Financial Resource Strain: Low Risk   . Difficulty of Paying Living Expenses: Not hard at all  Food Insecurity: No Food Insecurity  . Worried About Programme researcher, broadcasting/film/videounning Out of Food in the Last Year: Never true  . Ran Out of Food in the Last Year: Never true  Transportation Needs: No Transportation Needs  . Lack of Transportation (Medical): No  . Lack of Transportation (Non-Medical): No  Physical Activity: Sufficiently Active  . Days of Exercise per Week: 7 days  . Minutes of Exercise per Session: 90 min  Stress: No Stress Concern Present  .  Feeling of Stress : Not at all  Social Connections: Socially Isolated  . Frequency of Communication with Friends and Family: More than three times a week  . Frequency of Social Gatherings with Friends and Family: Not on file  . Attends Religious Services: Never  . Active Member of Clubs or Organizations: No  . Attends Banker Meetings: Never  . Marital Status: Widowed    Tobacco Counseling Counseling given: Not Answered   Clinical Intake:  Pre-visit preparation completed: Yes  Pain : No/denies pain     Nutritional Risks: None Diabetes:  No  How often do you need to have someone help you when you read instructions, pamphlets, or other written materials from your doctor or pharmacy?: 1 - Never    Interpreter Needed?: No  Information entered by :: Rachel Littler LPN   Activities of Daily Living In your present state of health, do you have any difficulty performing the following activities: 09/28/2019  Hearing? N  Comment declines hearing aids  Vision? N  Difficulty concentrating or making decisions? N  Walking or climbing stairs? N  Dressing or bathing? N  Doing errands, shopping? N  Preparing Food and eating ? N  Using the Toilet? N  In the past six months, have you accidently leaked urine? N  Do you have problems with loss of bowel control? N  Managing your Medications? N  Managing your Finances? N  Housekeeping or managing your Housekeeping? N  Some recent data might be hidden    Patient Care Team: Duanne Limerick, MD as PCP - General (Family Medicine)  Indicate any recent Medical Services you may have received from other than [facility] providers in the past year (date may be approximate).     Assessment:   This is a routine wellness examination for [name].  Hearing/Vision screen  Hearing Screening   125Hz  250Hz  500Hz  1000Hz  2000Hz  3000Hz  4000Hz  6000Hz  8000Hz   Right ear:           Left ear:           Comments: Pt denies hearing difficulty  Vision Screening Comments: [facility]  Dietary issues and exercise activities discussed: Current Exercise Habits: Home exercise routine, Type of exercise: walking, Time (Minutes): > 60, Frequency (Times/Week): 7, Weekly Exercise (Minutes/Week): 0, Intensity: Moderate, Exercise limited by: None identified  Goals    . Weight (lb) < 200 lb (90.7 kg)     Pt would like to lose weight over the next year with healthy eating and physical activity      Depression Screen PHQ 2/9 Scores 09/28/2019 07/20/2019 02/02/2019 07/15/2018 01/28/2018  12/28/2017 03/18/2017  PHQ - 2 Score 0 0 1 0 0 0 0  PHQ- 9 Score - 0 1 0 1 0 3    Fall Risk Fall Risk  09/28/2019 07/20/2019 02/02/2019  Falls in the past year? 0 0 0  Number falls in past yr: 0 - -  Injury with Fall? 0 - -  Risk for fall due to : No Fall Risks - -  Follow up Falls prevention discussed Falls evaluation completed Falls evaluation completed    Any stairs in or around the home? No  If so, are there any without handrails? No  Home free of loose throw rugs in walkways, pet beds, electrical cords, etc? Yes  Adequate lighting in your home to reduce risk of falls? Yes   ASSISTIVE DEVICES UTILIZED TO PREVENT FALLS:  Life alert? No  Use  of a cane, walker or w/c? No  Grab bars in the bathroom? Yes  Shower chair or bench in shower? Yes  Elevated toilet seat or a handicapped toilet? No   TIMED UP AND GO:  Was the test performed? No . Telephonic visit  Cognitive Function:     6CIT Screen 09/28/2019  What Year? 0 points  What month? 0 points  What time? 0 points  Count back from 20 2 points  Months in reverse 0 points  Repeat phrase 0 points  Total Score 2    Immunizations Immunization History  Administered Date(s) Administered  . Influenza,inj,Quad PF,6+ Mos 11/02/2017  . Influenza-Unspecified 11/02/2017, 10/15/2018  . PFIZER SARS-COV-2 Vaccination 02/17/2019, 03/15/2019    TDAP status: Due, Education has been provided regarding the importance of this vaccine. Advised may receive this vaccine at local pharmacy or Health Dept. Aware to provide a copy of the vaccination record if obtained from local pharmacy or Health Dept. Verbalized acceptance and understanding.   Flu Vaccine status: Declined, Education has been provided regarding the importance of this vaccine but patient still declined. Advised may receive this vaccine at local pharmacy or Health Dept. Aware to provide a copy of the vaccination record if obtained from local pharmacy or Health Dept. Verbalized  acceptance and understanding.   Pneumococcal vaccine status: Declined,  Education has been provided regarding the importance of this vaccine but patient still declined. Advised may receive this vaccine at local pharmacy or Health Dept. Aware to provide a copy of the vaccination record if obtained from local pharmacy or Health Dept. Verbalized acceptance and understanding.    Covid-19 vaccine status: Completed vaccines  Qualifies for Shingles Vaccine? Yes   Zostavax completed No   Shingrix Completed?: No.    Education has been provided regarding the importance of this vaccine. Patient has been advised to call insurance company to determine out of pocket expense if they have not yet received this vaccine. Advised may also receive vaccine at local pharmacy or Health Dept. Verbalized acceptance and understanding.  Screening Tests Health Maintenance  Topic Date Due  . PNA vac Low Risk Adult (1 of 2 - PCV13) Never done  . INFLUENZA VACCINE  08/07/2019  . COLONOSCOPY  02/02/2020 (Originally 01/06/2018)  . MAMMOGRAM  02/08/2021  . TETANUS/TDAP  01/07/2023  . DEXA SCAN  Completed  . COVID-19 Vaccine  Completed  . Hepatitis C Screening  Completed    Health Maintenance  Health Maintenance Due  Topic Date Due  . PNA vac Low Risk Adult (1 of 2 - PCV13) Never done  . INFLUENZA VACCINE  08/07/2019    Colorectal cancer screening: Completed 20. Repeat every 10 years   Mammogram status: Completed 02/09/19. Repeat every year   Bone Density status: Completed 02/09/19. Results reflect: Bone density results: NORMAL. Repeat every 2 years.  Lung Cancer Screening: (Low Dose CT Chest recommended if Age 32-80 years, 30 pack-year currently smoking OR have quit w/in 15years.) does not qualify.   Additional Screening:  Hepatitis C Screening: does qualify; Completed 04/28/16  Vision Screening: Recommended annual ophthalmology exams for early detection of glaucoma and other disorders of the eye. Is the patient  up to date with their annual eye exam?  Yes  Who is the provider or what is the name of the office in which the patient attends annual eye exams? [facility]  Dental Screening: Recommended annual dental exams for proper oral hygiene  Community Resource Referral / Chronic Care Management: CRR required this  visit?  No   CCM required this visit?  No      Plan:     I have personally reviewed and noted the following in the patient's chart:   . Medical and social history . Use of alcohol, tobacco or illicit drugs  . Current medications and supplements . Functional ability and status . Nutritional status . Physical activity . Advanced directives . List of other physicians . Hospitalizations, surgeries, and ER visits in previous 12 months . Vitals . Screenings to include cognitive, depression, and falls . Referrals and appointments  In addition, I have reviewed and discussed with patient certain preventive protocols, quality metrics, and best practice recommendations. A written personalized care plan for preventive services as well as general preventive health recommendations were provided to patient.     Rachel Littler, LPN   05/02/8339   Nurse Notes: none

## 2019-09-28 NOTE — Patient Instructions (Signed)
Rachel Burton , Thank you for taking time to come for your Medicare Wellness Visit. I appreciate your ongoing commitment to your health goals. Please review the following plan we discussed and let me know if I can assist you in the future.   Screening recommendations/referrals: Colonoscopy: done 2010. DUE.  Mammogram: done 02/09/19 Bone Density: done 02/09/19 Recommended yearly ophthalmology/optometry visit for glaucoma screening and checkup Recommended yearly dental visit for hygiene and checkup  Vaccinations: Influenza vaccine: due Pneumococcal vaccine: due Tdap vaccine: due Shingles vaccine: Shingrix discussed. Please contact your pharmacy for coverage information.  Covid-19: done 02/17/19 & 03/15/19  Advanced directives: Advance directive discussed with you today. I have provided a copy for you to complete at home and have notarized. Once this is complete please bring a copy in to our office so we can scan it into your chart.  Conditions/risks identified: Keep up the great work!  Next appointment: Follow up in one year for your annual wellness visit    Preventive Care 65 Years and Older, Female Preventive care refers to lifestyle choices and visits with your health care provider that can promote health and wellness. What does preventive care include?  A yearly physical exam. This is also called an annual well check.  Dental exams once or twice a year.  Routine eye exams. Ask your health care provider how often you should have your eyes checked.  Personal lifestyle choices, including:  Daily care of your teeth and gums.  Regular physical activity.  Eating a healthy diet.  Avoiding tobacco and drug use.  Limiting alcohol use.  Practicing safe sex.  Taking low-dose aspirin every day.  Taking vitamin and mineral supplements as recommended by your health care provider. What happens during an annual well check? The services and screenings done by your health care provider  during your annual well check will depend on your age, overall health, lifestyle risk factors, and family history of disease. Counseling  Your health care provider may ask you questions about your:  Alcohol use.  Tobacco use.  Drug use.  Emotional well-being.  Home and relationship well-being.  Sexual activity.  Eating habits.  History of falls.  Memory and ability to understand (cognition).  Work and work Astronomer.  Reproductive health. Screening  You may have the following tests or measurements:  Height, weight, and BMI.  Blood pressure.  Lipid and cholesterol levels. These may be checked every 5 years, or more frequently if you are over 64 years old.  Skin check.  Lung cancer screening. You may have this screening every year starting at age 4 if you have a 30-pack-year history of smoking and currently smoke or have quit within the past 15 years.  Fecal occult blood test (FOBT) of the stool. You may have this test every year starting at age 71.  Flexible sigmoidoscopy or colonoscopy. You may have a sigmoidoscopy every 5 years or a colonoscopy every 10 years starting at age 29.  Hepatitis C blood test.  Hepatitis B blood test.  Sexually transmitted disease (STD) testing.  Diabetes screening. This is done by checking your blood sugar (glucose) after you have not eaten for a while (fasting). You may have this done every 1-3 years.  Bone density scan. This is done to screen for osteoporosis. You may have this done starting at age 61.  Mammogram. This may be done every 1-2 years. Talk to your health care provider about how often you should have regular mammograms. Talk with your health care  provider about your test results, treatment options, and if necessary, the need for more tests. Vaccines  Your health care provider may recommend certain vaccines, such as:  Influenza vaccine. This is recommended every year.  Tetanus, diphtheria, and acellular pertussis  (Tdap, Td) vaccine. You may need a Td booster every 10 years.  Zoster vaccine. You may need this after age 2.  Pneumococcal 13-valent conjugate (PCV13) vaccine. One dose is recommended after age 12.  Pneumococcal polysaccharide (PPSV23) vaccine. One dose is recommended after age 85. Talk to your health care provider about which screenings and vaccines you need and how often you need them. This information is not intended to replace advice given to you by your health care provider. Make sure you discuss any questions you have with your health care provider. Document Released: 01/19/2015 Document Revised: 09/12/2015 Document Reviewed: 10/24/2014 Elsevier Interactive Patient Education  2017 [place] Prevention in the Home Falls can cause injuries. They can happen to people of all ages. There are many things you can do to make your home safe and to help prevent falls. What can I do on the outside of my home?  Regularly fix the edges of walkways and driveways and fix any cracks.  Remove anything that might make you trip as you walk through a door, such as a raised step or threshold.  Trim any bushes or trees on the path to your home.  Use bright outdoor lighting.  Clear any walking paths of anything that might make someone trip, such as rocks or tools.  Regularly check to see if handrails are loose or broken. Make sure that both sides of any steps have handrails.  Any raised decks and porches should have guardrails on the edges.  Have any leaves, snow, or ice cleared regularly.  Use sand or salt on walking paths during winter.  Clean up any spills in your garage right away. This includes oil or grease spills. What can I do in the bathroom?  Use night lights.  Install grab bars by the toilet and in the tub and shower. Do not use towel bars as grab bars.  Use non-skid mats or decals in the tub or shower.  If you need to sit down in the shower, use a plastic, non-slip  stool.  Keep the floor dry. Clean up any water that spills on the floor as soon as it happens.  Remove soap buildup in the tub or shower regularly.  Attach bath mats securely with double-sided non-slip rug tape.  Do not have throw rugs and other things on the floor that can make you trip. What can I do in the bedroom?  Use night lights.  Make sure that you have a light by your bed that is easy to reach.  Do not use any sheets or blankets that are too big for your bed. They should not hang down onto the floor.  Have a firm chair that has side arms. You can use this for support while you get dressed.  Do not have throw rugs and other things on the floor that can make you trip. What can I do in the kitchen?  Clean up any spills right away.  Avoid walking on wet floors.  Keep items that you use a lot in easy-to-reach places.  If you need to reach something above you, use a strong step stool that has a grab bar.  Keep electrical cords out of the way.  Do not use floor polish  or wax that makes floors slippery. If you must use wax, use non-skid floor wax.  Do not have throw rugs and other things on the floor that can make you trip. What can I do with my stairs?  Do not leave any items on the stairs.  Make sure that there are handrails on both sides of the stairs and use them. Fix handrails that are broken or loose. Make sure that handrails are as long as the stairways.  Check any carpeting to make sure that it is firmly attached to the stairs. Fix any carpet that is loose or worn.  Avoid having throw rugs at the top or bottom of the stairs. If you do have throw rugs, attach them to the floor with carpet tape.  Make sure that you have a light switch at the top of the stairs and the bottom of the stairs. If you do not have them, ask someone to add them for you. What else can I do to help prevent falls?  Wear shoes that:  Do not have high heels.  Have rubber bottoms.  Are  comfortable and fit you well.  Are closed at the toe. Do not wear sandals.  If you use a stepladder:  Make sure that it is fully opened. Do not climb a closed stepladder.  Make sure that both sides of the stepladder are locked into place.  Ask someone to hold it for you, if possible.  Clearly mark and make sure that you can see:  Any grab bars or handrails.  First and last steps.  Where the edge of each step is.  Use tools that help you move around (mobility aids) if they are needed. These include:  Canes.  Walkers.  Scooters.  Crutches.  Turn on the lights when you go into a dark area. Replace any light bulbs as soon as they burn out.  Set up your furniture so you have a clear path. Avoid moving your furniture around.  If any of your floors are uneven, fix them.  If there are any pets around you, be aware of where they are.  Review your medicines with your doctor. Some medicines can make you feel dizzy. This can increase your chance of falling. Ask your doctor what other things that you can do to help prevent falls. This information is not intended to replace advice given to you by your health care provider. Make sure you discuss any questions you have with your health care provider. Document Released: 10/19/2008 Document Revised: 05/31/2015 Document Reviewed: 01/27/2014 Elsevier Interactive Patient Education  2017 [employer].

## 2019-10-14 ENCOUNTER — Ambulatory Visit (INDEPENDENT_AMBULATORY_CARE_PROVIDER_SITE_OTHER): Payer: Medicare Other

## 2019-10-14 ENCOUNTER — Other Ambulatory Visit: Payer: Self-pay

## 2019-10-14 ENCOUNTER — Telehealth: Payer: Self-pay

## 2019-10-14 DIAGNOSIS — Z23 Encounter for immunization: Secondary | ICD-10-CM | POA: Diagnosis not present

## 2019-10-14 NOTE — Telephone Encounter (Unsigned)
Copied from CRM [id number]. Topic: General - Other >> Oct 14, 2019 10:33 AM Gwenlyn Fudge wrote: Reason for CRM: Pt called and is requesting to have an order placed for a pneumonia shot. Please advise.

## 2020-02-02 ENCOUNTER — Ambulatory Visit (INDEPENDENT_AMBULATORY_CARE_PROVIDER_SITE_OTHER): Payer: Medicare Other | Admitting: Family Medicine

## 2020-02-02 ENCOUNTER — Other Ambulatory Visit: Payer: Self-pay

## 2020-02-02 ENCOUNTER — Encounter: Payer: Self-pay | Admitting: Family Medicine

## 2020-02-02 VITALS — BP 128/80 | HR 76 | Ht 65.0 in | Wt 230.0 lb

## 2020-02-02 DIAGNOSIS — F5101 Primary insomnia: Secondary | ICD-10-CM | POA: Diagnosis not present

## 2020-02-02 DIAGNOSIS — Z0000 Encounter for general adult medical examination without abnormal findings: Secondary | ICD-10-CM | POA: Diagnosis not present

## 2020-02-02 DIAGNOSIS — Z1231 Encounter for screening mammogram for malignant neoplasm of breast: Secondary | ICD-10-CM | POA: Diagnosis not present

## 2020-02-02 DIAGNOSIS — F3341 Major depressive disorder, recurrent, in partial remission: Secondary | ICD-10-CM

## 2020-02-02 MED ORDER — TRAZODONE HCL 100 MG PO TABS: 100.0000 mg | ORAL_TABLET | Freq: Every day | ORAL | 1 refills | Status: DC

## 2020-02-02 MED ORDER — BUPROPION HCL 100 MG PO TABS: 100.0000 mg | ORAL_TABLET | Freq: Two times a day (BID) | ORAL | 1 refills | Status: DC

## 2020-02-02 NOTE — Progress Notes (Signed)
Date:  02/02/2020   Name:  Rachel Burton   DOB:  [birth year]   MRN:  [id number]   Chief Complaint: Annual Exam and Insomnia  Patient is a 65 year old female who presents for a comprehensive physical exam. The patient reports the following problems: med refills. Health maintenance has been reviewed   Insomnia Primary symptoms: difficulty falling asleep.  The current episode started more than one year. The onset quality is gradual. The problem has been gradually improving since onset. The treatment provided mild relief.  Anxiety Presents for follow-up visit. Symptoms include insomnia and nervous/anxious behavior. Patient reports no chest pain, compulsions, confusion, decreased concentration, depressed mood, dizziness, dry mouth, excessive worry, feeling of choking, hyperventilation, impotence, irritability, malaise, muscle tension, nausea, obsessions, palpitations, panic, restlessness, shortness of breath or suicidal ideas.      Lab Results  Component Value Date   CREATININE 0.87 02/02/2019   BUN 22 02/02/2019   NA 140 02/02/2019   K 4.6 02/02/2019   CL 101 02/02/2019   CO2 23 02/02/2019   Lab Results  Component Value Date   CHOL 256 (H) 02/02/2019   HDL 98 02/02/2019   LDLCALC 151 (H) 02/02/2019   TRIG 48 02/02/2019   CHOLHDL 2.7 01/28/2018   No results found for: TSH No results found for: HGBA1C No results found for: WBC, HGB, HCT, MCV, PLT No results found for: ALT, AST, GGT, ALKPHOS, BILITOT   Review of Systems  Constitutional: Negative.  Negative for chills, fatigue, fever, irritability and unexpected weight change.  HENT: Negative for congestion, ear discharge, ear pain, rhinorrhea, sinus pressure, sneezing and sore throat.   Eyes: Negative for double vision, photophobia, pain, discharge, redness and itching.  Respiratory: Negative for cough, shortness of breath, wheezing and stridor.   Cardiovascular: Negative for chest pain and palpitations.  Gastrointestinal:  Negative for abdominal pain, blood in stool, constipation, diarrhea, nausea and vomiting.  Endocrine: Negative for cold intolerance, heat intolerance, polydipsia, polyphagia and polyuria.  Genitourinary: Negative for dysuria, flank pain, frequency, hematuria, impotence, menstrual problem, pelvic pain, urgency, vaginal bleeding and vaginal discharge.  Musculoskeletal: Negative for arthralgias, back pain and myalgias.  Skin: Negative for rash.  Allergic/Immunologic: Negative for environmental allergies and food allergies.  Neurological: Negative for dizziness, weakness, light-headedness, numbness and headaches.  Hematological: Negative for adenopathy. Does not bruise/bleed easily.  Psychiatric/Behavioral: Negative for confusion, decreased concentration, dysphoric mood and suicidal ideas. The patient is nervous/anxious and has insomnia.     Patient Active Problem List   Diagnosis Date Noted  . Post-menopausal 02/02/2019  . Insomnia 09/28/2015  . Depression 09/28/2015    Allergies  Allergen Reactions  . Levofloxacin Other (See Comments)  . Zolpidem Other (See Comments)    Past Surgical History:  Procedure Laterality Date  . COLONOSCOPY  2010   cleared for 10 years    Social History   Tobacco Use  . Smoking status: Never Smoker  . Smokeless tobacco: Never Used  Vaping Use  . Vaping Use: Never used  Substance Use Topics  . Alcohol use: No  . Drug use: No     Medication list has been reviewed and updated.  Current Meds  Medication Sig  . buPROPion (WELLBUTRIN) 100 MG tablet Take 1 tablet (100 mg total) by mouth 2 (two) times daily.  . traZODone (DESYREL) 100 MG tablet Take 1 tablet (100 mg total) by mouth at bedtime.    PHQ 2/9 Scores 02/02/2020 09/28/2019 07/20/2019 02/02/2019  PHQ - 2 Score 0  0 0 1  PHQ- 9 Score 0 - 0 1    GAD 7 : Generalized Anxiety Score 02/02/2020 07/20/2019 02/02/2019 07/15/2018  Nervous, Anxious, on Edge 0 0 0 0  Control/stop worrying 0 0 0 0  Worry  too much - different things 0 0 0 0  Trouble relaxing 0 0 0 0  Restless 0 0 0 0  Easily annoyed or irritable 0 0 0 0  Afraid - awful might happen 0 0 0 0  Total GAD 7 Score 0 0 0 0    BP Readings from Last 3 Encounters:  02/02/20 128/80  07/20/19 120/82  02/02/19 130/88    Physical Exam Vitals and nursing note reviewed. Exam conducted with a chaperone present.  Constitutional:      Appearance: She is overweight.  HENT:     Head: Normocephalic.     Jaw: There is normal jaw occlusion.     Right Ear: Tympanic membrane, ear canal and external ear normal. No decreased hearing noted.     Left Ear: Tympanic membrane, ear canal and external ear normal. No decreased hearing noted.     Nose: Nose normal.     Mouth/Throat:     Lips: Pink.     Mouth: Mucous membranes are moist.     Pharynx: Oropharynx is clear. Uvula midline.  Eyes:     General: Lids are normal. Vision grossly intact. Gaze aligned appropriately.     Extraocular Movements: Extraocular movements intact.     Conjunctiva/sclera: Conjunctivae normal.     Pupils: Pupils are equal, round, and reactive to light.  Neck:     Thyroid: No thyroid mass, thyromegaly or thyroid tenderness.     Vascular: Normal carotid pulses. No carotid bruit, hepatojugular reflux or JVD.     Trachea: Trachea and phonation normal.  Cardiovascular:     Chest Wall: PMI is not displaced. No thrill.     Pulses: Normal pulses. No decreased pulses.          Carotid pulses are 2+ on the right side and 2+ on the left side.      Radial pulses are 2+ on the right side and 2+ on the left side.       Femoral pulses are 2+ on the right side and 2+ on the left side.      Popliteal pulses are 2+ on the right side and 2+ on the left side.       Dorsalis pedis pulses are 2+ on the right side and 2+ on the left side.       Posterior tibial pulses are 2+ on the right side and 2+ on the left side.     Heart sounds: Normal heart sounds, S1 normal and S2 normal. Heart  sounds not distant. No murmur heard.  No systolic murmur is present.  No diastolic murmur is present. No friction rub. No gallop. No S3 or S4 sounds.   Pulmonary:     Breath sounds: Normal breath sounds. No decreased air movement. No decreased breath sounds, wheezing, rhonchi or rales.  Chest:  Breasts: Breasts are symmetrical.     Right: Normal. No swelling, bleeding, inverted nipple, mass, nipple discharge, skin change, tenderness, axillary adenopathy or supraclavicular adenopathy.     Left: Normal. No swelling, bleeding, inverted nipple, mass, nipple discharge, skin change, tenderness, axillary adenopathy or supraclavicular adenopathy.    Abdominal:     General: Bowel sounds are normal.     Palpations: Abdomen is soft. There  is no hepatomegaly or splenomegaly.     Tenderness: There is no abdominal tenderness.     Hernia: A hernia is present. Hernia is present in the umbilical area. There is no hernia in the ventral area.  Genitourinary:    Rectum: Normal. Guaiac result negative. No mass.  Musculoskeletal:     Cervical back: Normal, full passive range of motion without pain, normal range of motion and neck supple.     Thoracic back: Normal.     Lumbar back: Normal.     Right lower leg: No edema.     Left lower leg: No edema.  Lymphadenopathy:     Head:     Right side of head: No submental, submandibular or tonsillar adenopathy.     Left side of head: No submental, submandibular or tonsillar adenopathy.     Cervical: No cervical adenopathy.     Right cervical: No superficial, deep or posterior cervical adenopathy.    Left cervical: No superficial, deep or posterior cervical adenopathy.     Upper Body:     Right upper body: No supraclavicular, axillary or pectoral adenopathy.     Left upper body: No supraclavicular, axillary or pectoral adenopathy.     Lower Body: No right inguinal adenopathy. No left inguinal adenopathy.  Skin:    General: Skin is warm.     Capillary Refill:  Capillary refill takes less than 2 seconds.  Neurological:     General: No focal deficit present.     Mental Status: She is oriented to person, place, and time.     Cranial Nerves: Cranial nerves are intact.     Sensory: Sensation is intact.     Motor: Motor function is intact.     Deep Tendon Reflexes: Reflexes are normal and symmetric.  Psychiatric:        Behavior: Behavior is cooperative.     Wt Readings from Last 3 Encounters:  02/02/20 230 lb (104.3 kg)  07/20/19 219 lb (99.3 kg)  02/02/19 212 lb (96.2 kg)    BP 128/80   Pulse 76   Ht 5\' 5"  (1.651 m)   Wt 230 lb (104.3 kg)   BMI 38.27 kg/m   Assessment and Plan:  1. Annual physical exam No subjective/objective concerns noted during history and physical exam.  Patient's chart was reviewed from his previous encounter for review of medications and labs.Hadlyn Amero is a 65 y.o. female who presents today for her Complete Annual Exam. She feels well. She reports exercising . She reports she is sleeping well. Immunizations are reviewed and recommendations provided.   Age appropriate screening tests are discussed. Counseling given for risk factor reduction interventions.  Examination was unremarkable for any concerns breast exam was normal as was rectal exam which was guaiac negative.  We will check renal function panel and lipid panel for baseline on an annual basis. - Renal Function Panel - Lipid Panel With LDL/HDL Ratio  2. Breast cancer screening by mammogram Breast exam was normal with no palpable mass or abnormalities noted.  We will obtain a 3D breast mammogram. - MM 3D SCREEN BREAST BILATERAL; Future  3. Recurrent major depressive disorder, in partial remission (HCC) Chronic.  Controlled.  Stable.  PHQ 0 gad score is 0 we will continue Wellbutrin 100 mg 1 twice a day and will recheck in 6 months. - buPROPion (WELLBUTRIN) 100 MG tablet; Take 1 tablet (100 mg total) by mouth 2 (two) times daily.  Dispense: 180 tablet;  Refill: 1  4. Primary insomnia Chronic.  Controlled.  Stable.  Patient is sleeping on the current dosing of decibel 100 mg nightly and we will continue this. - traZODone (DESYREL) 100 MG tablet; Take 1 tablet (100 mg total) by mouth at bedtime.  Dispense: 90 tablet; Refill: 1

## 2020-02-03 LAB — RENAL FUNCTION PANEL
Albumin: 4.1 g/dL (ref 3.8–4.8)
BUN/Creatinine Ratio: 23 (ref 12–28)
BUN: 17 mg/dL (ref 8–27)
CO2: 26 mmol/L (ref 20–29)
Calcium: 9 mg/dL (ref 8.7–10.3)
Chloride: 101 mmol/L (ref 96–106)
Creatinine, Ser: 0.75 mg/dL (ref 0.57–1.00)
GFR calc Af Amer: 96 mL/min/{1.73_m2} (ref >59)
GFR calc non Af Amer: 83 mL/min/{1.73_m2} (ref >59)
Glucose: 80 mg/dL (ref 65–99)
Phosphorus: 3.3 mg/dL (ref 3.0–4.3)
Potassium: 4.5 mmol/L (ref 3.5–5.2)
Sodium: 140 mmol/L (ref 134–144)

## 2020-02-03 LAB — LIPID PANEL WITH LDL/HDL RATIO
Cholesterol, Total: 234 mg/dL — ABNORMAL HIGH (ref 100–199)
HDL: 101 mg/dL (ref >39)
LDL Chol Calc (NIH): 126 mg/dL — ABNORMAL HIGH (ref 0–99)
LDL/HDL Ratio: 1.2 ratio (ref 0.0–3.2)
Triglycerides: 39 mg/dL (ref 0–149)
VLDL Cholesterol Cal: 7 mg/dL (ref 5–40)

## 2020-02-16 ENCOUNTER — Other Ambulatory Visit: Payer: Self-pay

## 2020-02-16 ENCOUNTER — Ambulatory Visit
Admission: RE | Admit: 2020-02-16 | Discharge: 2020-02-16 | Disposition: A | Discharge status: Home / Self Care | Payer: Medicare Other | Source: Ambulatory Visit | Attending: Family Medicine | Admitting: Family Medicine

## 2020-02-16 DIAGNOSIS — Z1231 Encounter for screening mammogram for malignant neoplasm of breast: Secondary | ICD-10-CM | POA: Insufficient documentation

## 2020-07-18 ENCOUNTER — Other Ambulatory Visit: Payer: Self-pay

## 2020-07-18 ENCOUNTER — Ambulatory Visit (INDEPENDENT_AMBULATORY_CARE_PROVIDER_SITE_OTHER): Payer: Medicare Other | Admitting: Family Medicine

## 2020-07-18 ENCOUNTER — Encounter: Payer: Self-pay | Admitting: Family Medicine

## 2020-07-18 VITALS — BP 138/86 | HR 76 | Ht 65.0 in | Wt 238.0 lb

## 2020-07-18 DIAGNOSIS — F3341 Major depressive disorder, recurrent, in partial remission: Secondary | ICD-10-CM | POA: Diagnosis not present

## 2020-07-18 DIAGNOSIS — E7801 Familial hypercholesterolemia: Secondary | ICD-10-CM | POA: Diagnosis not present

## 2020-07-18 DIAGNOSIS — Z6839 Body mass index (BMI) 39.0-39.9, adult: Secondary | ICD-10-CM

## 2020-07-18 DIAGNOSIS — F5101 Primary insomnia: Secondary | ICD-10-CM

## 2020-07-18 MED ORDER — BUPROPION HCL 100 MG PO TABS: 100.0000 mg | ORAL_TABLET | Freq: Two times a day (BID) | ORAL | 1 refills | Status: DC

## 2020-07-18 MED ORDER — TRAZODONE HCL 100 MG PO TABS: 100.0000 mg | ORAL_TABLET | Freq: Every day | ORAL | 1 refills | Status: DC

## 2020-07-18 NOTE — Progress Notes (Signed)
Date:  07/18/2020   Name:  Rachel Burton   DOB:  09-28-1955   MRN:  [id number]   Chief Complaint: Depression and Insomnia  Depression        This is a chronic problem.  The current episode started more than 1 year ago.   The onset quality is gradual.   The problem occurs rarely.  The problem has been gradually improving since onset.  Associated symptoms include insomnia and appetite change.  Associated symptoms include no decreased concentration, no fatigue, no helplessness, no hopelessness, not irritable, no restlessness, no decreased interest, no body aches, no myalgias, no headaches, no indigestion, not sad and no suicidal ideas.     The symptoms are aggravated by medication.  Past treatments include SSRIs - Selective serotonin reuptake inhibitors.  Compliance with treatment is good.  Previous treatment provided moderate relief. Insomnia Primary symptoms: fragmented sleep, no difficulty falling asleep.   The current episode started more than one year. The onset quality is gradual. The problem has been waxing and waning since onset. PMH includes: no hypertension, depression, no family stress or anxiety.    Lab Results  Component Value Date   CREATININE 0.75 02/02/2020   BUN 17 02/02/2020   NA 140 02/02/2020   K 4.5 02/02/2020   CL 101 02/02/2020   CO2 26 02/02/2020   Lab Results  Component Value Date   CHOL 234 (H) 02/02/2020   HDL 101 02/02/2020   LDLCALC 126 (H) 02/02/2020   TRIG 39 02/02/2020   CHOLHDL 2.7 01/28/2018   No results found for: TSH No results found for: HGBA1C No results found for: WBC, HGB, HCT, MCV, PLT No results found for: ALT, AST, GGT, ALKPHOS, BILITOT   Review of Systems  Constitutional:  Positive for appetite change. Negative for chills, fatigue and fever.  HENT:  Negative for drooling, ear discharge, ear pain and sore throat.   Respiratory:  Negative for cough, shortness of breath and wheezing.   Cardiovascular:  Negative for chest pain,  palpitations and leg swelling.  Gastrointestinal:  Negative for abdominal pain, blood in stool, constipation, diarrhea and nausea.  Endocrine: Negative for polydipsia.  Genitourinary:  Negative for dysuria, frequency, hematuria and urgency.  Musculoskeletal:  Negative for back pain, myalgias and neck pain.  Skin:  Negative for rash.  Allergic/Immunologic: Negative for environmental allergies.  Neurological:  Negative for dizziness and headaches.  Hematological:  Does not bruise/bleed easily.  Psychiatric/Behavioral:  Positive for depression. Negative for decreased concentration and suicidal ideas. The patient has insomnia. The patient is not nervous/anxious.    Patient Active Problem List   Diagnosis Date Noted   Post-menopausal 02/02/2019   Insomnia 09/28/2015   Depression 09/28/2015    Allergies  Allergen Reactions   Levofloxacin Other (See Comments)   Zolpidem Other (See Comments)    Past Surgical History:  Procedure Laterality Date   COLONOSCOPY  2010   cleared for 10 years    Social History   Tobacco Use   Smoking status: Never   Smokeless tobacco: Never  Vaping Use   Vaping Use: Never used  Substance Use Topics   Alcohol use: No   Drug use: No     Medication list has been reviewed and updated.  Current Meds  Medication Sig   buPROPion (WELLBUTRIN) 100 MG tablet Take 1 tablet (100 mg total) by mouth 2 (two) times daily.   traZODone (DESYREL) 100 MG tablet Take 1 tablet (100 mg total) by mouth at bedtime.  PHQ 2/9 Scores 07/18/2020 02/02/2020 09/28/2019 07/20/2019  PHQ - 2 Score 0 0 0 0  PHQ- 9 Score 0 0 - 0    GAD 7 : Generalized Anxiety Score 07/18/2020 02/02/2020 07/20/2019 02/02/2019  Nervous, Anxious, on Edge 0 0 0 0  Control/stop worrying 0 0 0 0  Worry too much - different things 0 0 0 0  Trouble relaxing 0 0 0 0  Restless 0 0 0 0  Easily annoyed or irritable 0 0 0 0  Afraid - awful might happen 0 0 0 0  Total GAD 7 Score 0 0 0 0    BP Readings  from Last 3 Encounters:  07/18/20 (!) 138/92  02/02/20 128/80  07/20/19 120/82    Physical Exam Vitals and nursing note reviewed.  Constitutional:      General: She is not irritable.She is not in acute distress.    Appearance: She is not diaphoretic.  HENT:     Head: Normocephalic and atraumatic.     Right Ear: Tympanic membrane, ear canal and external ear normal.     Left Ear: Tympanic membrane, ear canal and external ear normal.     Nose: Nose normal.  Eyes:     General:        Right eye: No discharge.        Left eye: No discharge.     Conjunctiva/sclera: Conjunctivae normal.     Pupils: Pupils are equal, round, and reactive to light.  Neck:     Thyroid: No thyromegaly.     Vascular: No JVD.  Cardiovascular:     Rate and Rhythm: Normal rate and regular rhythm.     Heart sounds: Normal heart sounds. No murmur heard.   No friction rub. No gallop.  Pulmonary:     Effort: Pulmonary effort is normal.     Breath sounds: Normal breath sounds.  Abdominal:     General: Bowel sounds are normal.     Palpations: Abdomen is soft. There is no mass.     Tenderness: There is no abdominal tenderness. There is no guarding or rebound.  Musculoskeletal:        General: Normal range of motion.     Cervical back: Normal range of motion and neck supple.  Lymphadenopathy:     Cervical: No cervical adenopathy.  Skin:    General: Skin is warm and dry.  Neurological:     Mental Status: She is alert.     Deep Tendon Reflexes: Reflexes are normal and symmetric.    Wt Readings from Last 3 Encounters:  07/18/20 238 lb (108 kg)  02/02/20 230 lb (104.3 kg)  07/20/19 219 lb (99.3 kg)    BP (!) 138/92   Pulse 76   Ht 5\' 5"  (1.651 m)   Wt 238 lb (108 kg)   BMI 39.61 kg/m   Assessment and Plan: 1. Recurrent major depressive disorder, in partial remission (HCC) Chronic.  Controlled.  Stable.  PHQ is 0 Gad score is 0.  Continue bupropion 100 mg 1 twice a day. - buPROPion (WELLBUTRIN) 100  MG tablet; Take 1 tablet (100 mg total) by mouth 2 (two) times daily.  Dispense: 180 tablet; Refill: 1  2. Primary insomnia Chronic.  Controlled.  Stable.  Except for occasional awakening but patient is able to resume sleep pretty readily patient is doing well with current dosing of trazodone 100 mg nightly. - traZODone (DESYREL) 100 MG tablet; Take 1 tablet (100 mg total) by mouth at  bedtime.  Dispense: 90 tablet; Refill: 1  3. Familial hypercholesterolemia Previous to lipid panel have had elevated LDLs.  Patient is not fasting this morning so she will return for a lipid panel tomorrow.  We have gone ahead to give her low-cholesterol sheet to begin preparations for decreasing cholesterol with diet. - Lipid Panel With LDL/HDL Ratio  4. BMI 39.0-39.9,adult Patient has had BMI is in the 40 and above is currently under 39 and somewhat elevated weight from the previous but that has been attributed to her trip to [facility] and patient is clinically better with her diet upon returning. - Lipid Panel With LDL/HDL Ratio

## 2020-07-18 NOTE — Patient Instructions (Signed)

## 2020-07-20 LAB — LIPID PANEL WITH LDL/HDL RATIO
Cholesterol, Total: 223 mg/dL — ABNORMAL HIGH (ref 100–199)
HDL: 93 mg/dL (ref >39)
LDL Chol Calc (NIH): 122 mg/dL — ABNORMAL HIGH (ref 0–99)
LDL/HDL Ratio: 1.3 ratio (ref 0.0–3.2)
Triglycerides: 44 mg/dL (ref 0–149)
VLDL Cholesterol Cal: 8 mg/dL (ref 5–40)

## 2020-09-18 DIAGNOSIS — H40003 Preglaucoma, unspecified, bilateral: Secondary | ICD-10-CM | POA: Diagnosis not present

## 2020-10-01 ENCOUNTER — Ambulatory Visit (INDEPENDENT_AMBULATORY_CARE_PROVIDER_SITE_OTHER): Payer: Medicare Other

## 2020-10-01 DIAGNOSIS — Z0000 Encounter for general adult medical examination without abnormal findings: Secondary | ICD-10-CM | POA: Diagnosis not present

## 2020-10-01 NOTE — Patient Instructions (Signed)
Rachel Burton , Thank you for taking time to come for your Medicare Wellness Visit. I appreciate your ongoing commitment to your health goals. Please review the following plan we discussed and let me know if I can assist you in the future.   Screening recommendations/referrals: Colonoscopy: done 2010; declined repeat screening.  Mammogram: done 02/16/20 Bone Density: done 02/09/19 Recommended yearly ophthalmology/optometry visit for glaucoma screening and checkup Recommended yearly dental visit for hygiene and checkup  Vaccinations: Influenza vaccine: due Pneumococcal vaccine: done 10/14/19; due for Pneumovax23 Tdap vaccine: due  Shingles vaccine: Shingrix discussed. Please contact your pharmacy for coverage information.  Covid-19:done 02/17/19, 03/15/19, 01/04/20 & 06/02/20  Advanced directives: Please bring a copy of your health care power of attorney and living will to the office at your convenience once you have completed those documents.   Conditions/risks identified: Recommend healthy eating and physical activity for desired weight loss  Next appointment: Follow up in one year for your annual wellness visit    Preventive Care 65 Years and Older, Female Preventive care refers to lifestyle choices and visits with your health care provider that can promote health and wellness. What does preventive care include? A yearly physical exam. This is also called an annual well check. Dental exams once or twice a year. Routine eye exams. Ask your health care provider how often you should have your eyes checked. Personal lifestyle choices, including: Daily care of your teeth and gums. Regular physical activity. Eating a healthy diet. Avoiding tobacco and drug use. Limiting alcohol use. Practicing safe sex. Taking low-dose aspirin every day. Taking vitamin and mineral supplements as recommended by your health care provider. What happens during an annual well check? The services and screenings  done by your health care provider during your annual well check will depend on your age, overall health, lifestyle risk factors, and family history of disease. Counseling  Your health care provider may ask you questions about your: Alcohol use. Tobacco use. Drug use. Emotional well-being. Home and relationship well-being. Sexual activity. Eating habits. History of falls. Memory and ability to understand (cognition). Work and work Astronomer. Reproductive health. Screening  You may have the following tests or measurements: Height, weight, and BMI. Blood pressure. Lipid and cholesterol levels. These may be checked every 5 years, or more frequently if you are over 29 years old. Skin check. Lung cancer screening. You may have this screening every year starting at age 39 if you have a 30-pack-year history of smoking and currently smoke or have quit within the past 15 years. Fecal occult blood test (FOBT) of the stool. You may have this test every year starting at age 59. Flexible sigmoidoscopy or colonoscopy. You may have a sigmoidoscopy every 5 years or a colonoscopy every 10 years starting at age 60. Hepatitis C blood test. Hepatitis B blood test. Sexually transmitted disease (STD) testing. Diabetes screening. This is done by checking your blood sugar (glucose) after you have not eaten for a while (fasting). You may have this done every 1-3 years. Bone density scan. This is done to screen for osteoporosis. You may have this done starting at age 2. Mammogram. This may be done every 1-2 years. Talk to your health care provider about how often you should have regular mammograms. Talk with your health care provider about your test results, treatment options, and if necessary, the need for more tests. Vaccines  Your health care provider may recommend certain vaccines, such as: Influenza vaccine. This is recommended every year. Tetanus, diphtheria,  and acellular pertussis (Tdap, Td)  vaccine. You may need a Td booster every 10 years. Zoster vaccine. You may need this after age 36. Pneumococcal 13-valent conjugate (PCV13) vaccine. One dose is recommended after age 53. Pneumococcal polysaccharide (PPSV23) vaccine. One dose is recommended after age 68. Talk to your health care provider about which screenings and vaccines you need and how often you need them. This information is not intended to replace advice given to you by your health care provider. Make sure you discuss any questions you have with your health care provider. Document Released: 01/19/2015 Document Revised: 09/12/2015 Document Reviewed: 10/24/2014 Elsevier Interactive Patient Education  2017 [employer] Prevention in the Home Falls can cause injuries. They can happen to people of all ages. There are many things you can do to make your home safe and to help prevent falls. What can I do on the outside of my home? Regularly fix the edges of walkways and driveways and fix any cracks. Remove anything that might make you trip as you walk through a door, such as a raised step or threshold. Trim any bushes or trees on the path to your home. Use bright outdoor lighting. Clear any walking paths of anything that might make someone trip, such as rocks or tools. Regularly check to see if handrails are loose or broken. Make sure that both sides of any steps have handrails. Any raised decks and porches should have guardrails on the edges. Have any leaves, snow, or ice cleared regularly. Use sand or salt on walking paths during winter. Clean up any spills in your garage right away. This includes oil or grease spills. What can I do in the bathroom? Use night lights. Install grab bars by the toilet and in the tub and shower. Do not use towel bars as grab bars. Use non-skid mats or decals in the tub or shower. If you need to sit down in the shower, use a plastic, non-slip stool. Keep the floor dry. Clean up any  water that spills on the floor as soon as it happens. Remove soap buildup in the tub or shower regularly. Attach bath mats securely with double-sided non-slip rug tape. Do not have throw rugs and other things on the floor that can make you trip. What can I do in the bedroom? Use night lights. Make sure that you have a light by your bed that is easy to reach. Do not use any sheets or blankets that are too big for your bed. They should not hang down onto the floor. Have a firm chair that has side arms. You can use this for support while you get dressed. Do not have throw rugs and other things on the floor that can make you trip. What can I do in the kitchen? Clean up any spills right away. Avoid walking on wet floors. Keep items that you use a lot in easy-to-reach places. If you need to reach something above you, use a strong step stool that has a grab bar. Keep electrical cords out of the way. Do not use floor polish or wax that makes floors slippery. If you must use wax, use non-skid floor wax. Do not have throw rugs and other things on the floor that can make you trip. What can I do with my stairs? Do not leave any items on the stairs. Make sure that there are handrails on both sides of the stairs and use them. Fix handrails that are broken or loose. Make sure  that handrails are as long as the stairways. Check any carpeting to make sure that it is firmly attached to the stairs. Fix any carpet that is loose or worn. Avoid having throw rugs at the top or bottom of the stairs. If you do have throw rugs, attach them to the floor with carpet tape. Make sure that you have a light switch at the top of the stairs and the bottom of the stairs. If you do not have them, ask someone to add them for you. What else can I do to help prevent falls? Wear shoes that: Do not have high heels. Have rubber bottoms. Are comfortable and fit you well. Are closed at the toe. Do not wear sandals. If you use a  stepladder: Make sure that it is fully opened. Do not climb a closed stepladder. Make sure that both sides of the stepladder are locked into place. Ask someone to hold it for you, if possible. Clearly mark and make sure that you can see: Any grab bars or handrails. First and last steps. Where the edge of each step is. Use tools that help you move around (mobility aids) if they are needed. These include: Canes. Walkers. Scooters. Crutches. Turn on the lights when you go into a dark area. Replace any light bulbs as soon as they burn out. Set up your furniture so you have a clear path. Avoid moving your furniture around. If any of your floors are uneven, fix them. If there are any pets around you, be aware of where they are. Review your medicines with your doctor. Some medicines can make you feel dizzy. This can increase your chance of falling. Ask your doctor what other things that you can do to help prevent falls. This information is not intended to replace advice given to you by your health care provider. Make sure you discuss any questions you have with your health care provider. Document Released: 10/19/2008 Document Revised: 05/31/2015 Document Reviewed: 01/27/2014 Elsevier Interactive Patient Education  2017 [employer].

## 2020-10-01 NOTE — Progress Notes (Signed)
Subjective:   Rachel Burton is a 65 y.o. female who presents for Medicare Annual (Subsequent) preventive examination.  Virtual Visit via Telephone Note  I connected with  Clerance Lav on 10/01/20 at  1:20 PM EDT by telephone and verified that I am speaking with the correct person using two identifiers.  Location: Patient: home Provider: [facility] Persons participating in the virtual visit: patient/Nurse Health Advisor   I discussed the limitations, risks, security and privacy concerns of performing an evaluation and management service by telephone and the availability of in person appointments. The patient expressed understanding and agreed to proceed.  Interactive audio and video telecommunications were attempted between this nurse and patient, however failed, due to patient having technical difficulties OR patient did not have access to video capability.  We continued and completed visit with audio only.  Some vital signs may be absent or patient reported.   Reather Littler, LPN   Review of Systems     Cardiac Risk Factors include: advanced age (>2men, >36 women)     Objective:    There were no vitals filed for this visit. There is no height or weight on file to calculate BMI.  Advanced Directives 10/01/2020 09/28/2019  Does Patient Have a Medical Advance Directive? No Yes  Does patient want to make changes to medical advance directive? - Yes (MAU/Ambulatory/Procedural Areas - Information given)  Would patient like information on creating a medical advance directive? No - Patient declined -    Current Medications (verified) Outpatient Encounter Medications as of 10/01/2020  Medication Sig   buPROPion (WELLBUTRIN) 100 MG tablet Take 1 tablet (100 mg total) by mouth 2 (two) times daily.   traZODone (DESYREL) 100 MG tablet Take 1 tablet (100 mg total) by mouth at bedtime.   No facility-administered encounter medications on file as of 10/01/2020.    Allergies  (verified) Levofloxacin and Zolpidem   History: Past Medical History:  Diagnosis Date   Allergy    Depression    Insomnia    Past Surgical History:  Procedure Laterality Date   COLONOSCOPY  2010   cleared for 10 years   Family History  Problem Relation Age of Onset   Breast cancer Mother 27   Cancer Mother    Heart disease Maternal Grandmother    Hypertension Maternal Grandmother    Heart disease Maternal Grandfather    Social History   Socioeconomic History   Marital status: Widowed    Spouse name: Not on file   Number of children: 0   Years of education: Not on file   Highest education level: Not on file  Occupational History   Not on file  Tobacco Use   Smoking status: Never   Smokeless tobacco: Never  Vaping Use   Vaping Use: Never used  Substance and Sexual Activity   Alcohol use: No   Drug use: No   Sexual activity: Not Currently    Birth control/protection: Post-menopausal  Other Topics Concern   Not on file  Social History Narrative   Pt lives alone   Social Determinants of Health   Financial Resource Strain: Low Risk    Difficulty of Paying Living Expenses: Not hard at all  Food Insecurity: No Food Insecurity   Worried About Programme researcher, broadcasting/film/video in the Last Year: Never true   Ran Out of Food in the Last Year: Never true  Transportation Needs: No Transportation Needs   Lack of Transportation (Medical): No   Lack of Transportation (Non-Medical): No  Physical Activity: Sufficiently Active   Days of Exercise per Week: 7 days   Minutes of Exercise per Session: 90 min  Stress: No Stress Concern Present   Feeling of Stress : Not at all  Social Connections: Moderately Isolated   Frequency of Communication with Friends and Family: More than three times a week   Frequency of Social Gatherings with Friends and Family: Three times a week   Attends Religious Services: Never   Active Member of Clubs or Organizations: Yes   Attends Banker  Meetings: More than 4 times per year   Marital Status: Widowed    Tobacco Counseling Counseling given: Not Answered   Clinical Intake:  Pre-visit preparation completed: Yes  Pain : No/denies pain     Nutritional Risks: None Diabetes: No  How often do you need to have someone help you when you read instructions, pamphlets, or other written materials from your doctor or pharmacy?: 1 - Never    Interpreter Needed?: No  Information entered by :: Reather Littler LPN   Activities of Daily Living In your present state of health, do you have any difficulty performing the following activities: 10/01/2020  Hearing? N  Vision? Y  Difficulty concentrating or making decisions? N  Walking or climbing stairs? N  Dressing or bathing? N  Doing errands, shopping? N  Preparing Food and eating ? N  Using the Toilet? N  In the past six months, have you accidently leaked urine? N  Do you have problems with loss of bowel control? N  Managing your Medications? N  Managing your Finances? N  Housekeeping or managing your Housekeeping? N  Some recent data might be hidden    Patient Care Team: Duanne Limerick, MD as PCP - General (Family Medicine)  Indicate any recent Medical Services you may have received from other than [facility] providers in the past year (date may be approximate).     Assessment:   This is a routine wellness examination for [place].  Hearing/Vision screen Hearing Screening - Comments:: Pt denies hearing difficulty Vision Screening - Comments:: [facility]  Dietary issues and exercise activities discussed: Current Exercise Habits: Home exercise routine, Type of exercise: walking, Time (Minutes): > 60, Frequency (Times/Week): 7, Weekly Exercise (Minutes/Week): 0, Intensity: Moderate, Exercise limited by: None identified   Goals Addressed   None    Depression Screen PHQ 2/9 Scores 10/01/2020 07/18/2020 02/02/2020 09/28/2019 07/20/2019  02/02/2019 07/15/2018  PHQ - 2 Score 2 0 0 0 0 1 0  PHQ- 9 Score 3 0 0 - 0 1 0    Fall Risk Fall Risk  10/01/2020 07/18/2020 09/28/2019 07/20/2019 02/02/2019  Falls in the past year? 0 0 0 0 0  Number falls in past yr: 0 0 0 - -  Injury with Fall? 0 0 0 - -  Risk for fall due to : No Fall Risks No Fall Risks No Fall Risks - -  Follow up Falls prevention discussed Falls evaluation completed Falls prevention discussed Falls evaluation completed Falls evaluation completed    FALL RISK PREVENTION PERTAINING TO THE HOME:  Any stairs in or around the home? No  If so, are there any without handrails? No  Home free of loose throw rugs in walkways, pet beds, electrical cords, etc? Yes  Adequate lighting in your home to reduce risk of falls? Yes   ASSISTIVE DEVICES UTILIZED TO PREVENT FALLS:  Life alert? No  Use of a cane, walker or  w/c? No  Grab bars in the bathroom? Yes  Shower chair or bench in shower? Yes  Elevated toilet seat or a handicapped toilet? No   TIMED UP AND GO:  Was the test performed? No . Virtual visit.   Cognitive Function: Normal cognitive status assessed by direct observation by this Nurse Health Advisor. No abnormalities found.       6CIT Screen 09/28/2019  What Year? 0 points  What month? 0 points  What time? 0 points  Count back from 20 2 points  Months in reverse 0 points  Repeat phrase 0 points  Total Score 2    Immunizations Immunization History  Administered Date(s) Administered   Fluad Quad(high Dose 65+) 10/14/2019   Influenza,inj,Quad PF,6+ Mos 11/02/2017   Influenza-Unspecified 11/02/2017, 10/15/2018   PFIZER(Purple Top)SARS-COV-2 Vaccination 02/17/2019, 03/15/2019, 01/04/2020, 06/02/2020   Pneumococcal Conjugate-13 10/14/2019    TDAP status: Due, Education has been provided regarding the importance of this vaccine. Advised may receive this vaccine at local pharmacy or Health Dept. Aware to provide a copy of the vaccination record if obtained from  local pharmacy or Health Dept. Verbalized acceptance and understanding.  Flu Vaccine status: Due, Education has been provided regarding the importance of this vaccine. Advised may receive this vaccine at local pharmacy or Health Dept. Aware to provide a copy of the vaccination record if obtained from local pharmacy or Health Dept. Verbalized acceptance and understanding.  Pneumococcal vaccine status: Due, Education has been provided regarding the importance of this vaccine. Advised may receive this vaccine at local pharmacy or Health Dept. Aware to provide a copy of the vaccination record if obtained from local pharmacy or Health Dept. Verbalized acceptance and understanding.  Covid-19 vaccine status: Completed vaccines  Qualifies for Shingles Vaccine? Yes   Zostavax completed No   Shingrix Completed?: No.    Education has been provided regarding the importance of this vaccine. Patient has been advised to call insurance company to determine out of pocket expense if they have not yet received this vaccine. Advised may also receive vaccine at local pharmacy or Health Dept. Verbalized acceptance and understanding.  Screening Tests Health Maintenance  Topic Date Due   INFLUENZA VACCINE  08/06/2020   Zoster Vaccines- Shingrix (1 of 2) 10/18/2020 (Originally 04/22/2003)   MAMMOGRAM  02/15/2022   TETANUS/TDAP  01/07/2023   DEXA SCAN  Completed   COVID-19 Vaccine  Completed   Hepatitis C Screening  Completed   HPV VACCINES  Aged Out   COLONOSCOPY (Pts 45-47yrs Insurance coverage will need to be confirmed)  Discontinued    Health Maintenance  Health Maintenance Due  Topic Date Due   INFLUENZA VACCINE  08/06/2020    Colorectal cancer screening: Type of screening: Colonoscopy. Completed 2010. Repeat every 10 years. Pt declines repeat screening colonoscopy  Mammogram status: Completed 02/16/20. Repeat every year  Bone Density status: Completed 02/09/19. Results reflect: Bone density results:  NORMAL. Repeat every 2 years.  Lung Cancer Screening: (Low Dose CT Chest recommended if Age 67-80 years, 30 pack-year currently smoking OR have quit w/in 15years.) does not qualify.   Additional Screening:  Hepatitis C Screening: does qualify; Completed 04/28/16  Vision Screening: Recommended annual ophthalmology exams for early detection of glaucoma and other disorders of the eye. Is the patient up to date with their annual eye exam?  Yes  Who is the provider or what is the name of the office in which the patient attends annual eye exams? [facility].   Dental Screening:  Recommended annual dental exams for proper oral hygiene  Community Resource Referral / Chronic Care Management: CRR required this visit?  No   CCM required this visit?  No      Plan:     I have personally reviewed and noted the following in the patient's chart:   Medical and social history Use of alcohol, tobacco or illicit drugs  Current medications and supplements including opioid prescriptions.  Functional ability and status Nutritional status Physical activity Advanced directives List of other physicians Hospitalizations, surgeries, and ER visits in previous 12 months Vitals Screenings to include cognitive, depression, and falls Referrals and appointments  In addition, I have reviewed and discussed with patient certain preventive protocols, quality metrics, and best practice recommendations. A written personalized care plan for preventive services as well as general preventive health recommendations were provided to patient.     Reather Littler, LPN   [id number]   Nurse Notes: none

## 2020-11-09 ENCOUNTER — Other Ambulatory Visit: Payer: Self-pay | Admitting: Family Medicine

## 2020-11-09 DIAGNOSIS — F3341 Major depressive disorder, recurrent, in partial remission: Secondary | ICD-10-CM

## 2020-11-09 DIAGNOSIS — F5101 Primary insomnia: Secondary | ICD-10-CM

## 2021-01-18 ENCOUNTER — Ambulatory Visit: Payer: Self-pay | Admitting: Family Medicine

## 2021-02-05 ENCOUNTER — Encounter: Payer: Self-pay | Admitting: Family Medicine

## 2021-02-05 ENCOUNTER — Ambulatory Visit (INDEPENDENT_AMBULATORY_CARE_PROVIDER_SITE_OTHER): Payer: Medicare Other | Admitting: Family Medicine

## 2021-02-05 ENCOUNTER — Other Ambulatory Visit: Payer: Self-pay

## 2021-02-05 VITALS — BP 130/88 | HR 88 | Ht 65.0 in | Wt 217.0 lb

## 2021-02-05 DIAGNOSIS — Z1231 Encounter for screening mammogram for malignant neoplasm of breast: Secondary | ICD-10-CM | POA: Diagnosis not present

## 2021-02-05 DIAGNOSIS — Z0000 Encounter for general adult medical examination without abnormal findings: Secondary | ICD-10-CM | POA: Diagnosis not present

## 2021-02-05 DIAGNOSIS — Z23 Encounter for immunization: Secondary | ICD-10-CM

## 2021-02-05 DIAGNOSIS — E7801 Familial hypercholesterolemia: Secondary | ICD-10-CM | POA: Diagnosis not present

## 2021-02-05 DIAGNOSIS — F5101 Primary insomnia: Secondary | ICD-10-CM | POA: Diagnosis not present

## 2021-02-05 DIAGNOSIS — F3341 Major depressive disorder, recurrent, in partial remission: Secondary | ICD-10-CM

## 2021-02-05 LAB — HEMOCCULT GUIAC POC 1CARD (OFFICE): Fecal Occult Blood, POC: NEGATIVE

## 2021-02-05 MED ORDER — BUPROPION HCL 100 MG PO TABS: 100.0000 mg | ORAL_TABLET | Freq: Two times a day (BID) | ORAL | 1 refills | Status: DC

## 2021-02-05 MED ORDER — TRAZODONE HCL 100 MG PO TABS: 100.0000 mg | ORAL_TABLET | Freq: Every day | ORAL | 1 refills | Status: DC

## 2021-02-05 NOTE — Progress Notes (Signed)
Date:  02/05/2021   Name:  Rachel Burton   DOB:  03-Nov-1955   MRN:  [id number]   Chief Complaint: Annual Exam and Insomnia  Patient is a 66 year old female who presents for a comprehensive physical exam. The patient reports the following problems: med refills . Health maintenance has been reviewed pneumococcal.    Insomnia Primary symptoms: no fragmented sleep, no sleep disturbance, no difficulty falling asleep, no somnolence, frequent awakening, no premature morning awakening, no malaise/fatigue, no napping.   The current episode started more than one year. The onset quality is gradual. The problem occurs intermittently. The problem has been waxing and waning since onset. The treatment provided moderate relief. PMH includes: no hypertension, depression.   Depression        This is a chronic problem.  The current episode started more than 1 year ago.   The onset quality is gradual.   The problem occurs intermittently.  The problem has been gradually improving since onset.  Associated symptoms include insomnia and irritable.  Associated symptoms include no decreased concentration, no fatigue, no helplessness, no hopelessness, no restlessness, no decreased interest, no appetite change, no body aches, no myalgias, no headaches, no indigestion, not sad and no suicidal ideas.     The symptoms are aggravated by nothing.  Past treatments include other medications.  Compliance with treatment is variable.  Lab Results  Component Value Date   NA 140 02/02/2020   K 4.5 02/02/2020   CO2 26 02/02/2020   GLUCOSE 80 02/02/2020   BUN 17 02/02/2020   CREATININE 0.75 02/02/2020   CALCIUM 9.0 02/02/2020   GFRNONAA 83 02/02/2020   Lab Results  Component Value Date   CHOL 223 (H) 07/19/2020   HDL 93 07/19/2020   LDLCALC 122 (H) 07/19/2020   TRIG 44 07/19/2020   CHOLHDL 2.7 01/28/2018   No results found for: TSH No results found for: HGBA1C No results found for: WBC, HGB, HCT, MCV, PLT No results  found for: ALT, AST, GGT, ALKPHOS, BILITOT No results found for: 25OHVITD2, 25OHVITD3, VD25OH   Review of Systems  Constitutional:  Negative for appetite change, chills, fatigue, fever and malaise/fatigue.  HENT:  Negative for drooling, ear discharge, ear pain and sore throat.   Respiratory:  Negative for cough, shortness of breath and wheezing.   Cardiovascular:  Negative for chest pain, palpitations and leg swelling.  Gastrointestinal:  Negative for abdominal pain, blood in stool, constipation, diarrhea and nausea.  Endocrine: Negative for polydipsia.  Genitourinary:  Negative for dysuria, frequency, hematuria and urgency.  Musculoskeletal:  Negative for back pain, myalgias and neck pain.  Skin:  Negative for rash.  Allergic/Immunologic: Negative for environmental allergies.  Neurological:  Negative for dizziness and headaches.  Hematological:  Does not bruise/bleed easily.  Psychiatric/Behavioral:  Positive for depression. Negative for decreased concentration, sleep disturbance and suicidal ideas. The patient has insomnia. The patient is not nervous/anxious.    Patient Active Problem List   Diagnosis Date Noted   Post-menopausal 02/02/2019   Insomnia 09/28/2015   Depression 09/28/2015    Allergies  Allergen Reactions   Levofloxacin Other (See Comments)   Zolpidem Other (See Comments)    Past Surgical History:  Procedure Laterality Date   COLONOSCOPY  2010   cleared for 10 years    Social History   Tobacco Use   Smoking status: Never   Smokeless tobacco: Never  Vaping Use   Vaping Use: Never used  Substance Use Topics  Alcohol use: No   Drug use: No     Medication list has been reviewed and updated.  Current Meds  Medication Sig   buPROPion (WELLBUTRIN) 100 MG tablet Take 1 tablet by mouth twice daily   traZODone (DESYREL) 100 MG tablet TAKE 1 TABLET BY MOUTH AT BEDTIME    PHQ 2/9 Scores 02/05/2021 10/01/2020 07/18/2020 02/02/2020  PHQ - 2 Score 0 2 0 0   PHQ- 9 Score 0 3 0 0    GAD 7 : Generalized Anxiety Score 02/05/2021 07/18/2020 02/02/2020 07/20/2019  Nervous, Anxious, on Edge 0 0 0 0  Control/stop worrying 0 0 0 0  Worry too much - different things 0 0 0 0  Trouble relaxing 0 0 0 0  Restless 0 0 0 0  Easily annoyed or irritable 0 0 0 0  Afraid - awful might happen 0 0 0 0  Total GAD 7 Score 0 0 0 0  Anxiety Difficulty Not difficult at all - - -    BP Readings from Last 3 Encounters:  02/05/21 130/88  07/18/20 138/86  02/02/20 128/80    Physical Exam Vitals and nursing note reviewed.  Constitutional:      General: She is irritable.     Appearance: She is well-developed.  HENT:     Head: Normocephalic.     Jaw: There is normal jaw occlusion.     Right Ear: Hearing, tympanic membrane, ear canal and external ear normal.     Left Ear: Hearing, tympanic membrane, ear canal and external ear normal.     Nose: Nose normal. No congestion or rhinorrhea.     Right Turbinates: Not enlarged, swollen or pale.     Left Turbinates: Not enlarged, swollen or pale.     Right Sinus: No frontal sinus tenderness.     Left Sinus: No frontal sinus tenderness.     Mouth/Throat:     Lips: Pink.     Mouth: Mucous membranes are moist.     Tongue: No lesions.     Palate: No mass.  Eyes:     General: Lids are normal. Vision grossly intact. Gaze aligned appropriately. No scleral icterus.       Left eye: No foreign body or hordeolum.     Conjunctiva/sclera: Conjunctivae normal.     Right eye: Right conjunctiva is not injected.     Left eye: Left conjunctiva is not injected.     Pupils: Pupils are equal, round, and reactive to light.     Funduscopic exam:    Right eye: Red reflex present.        Left eye: Red reflex present. Neck:     Thyroid: No thyroid mass, thyromegaly or thyroid tenderness.     Vascular: No JVD.     Trachea: No tracheal deviation.  Cardiovascular:     Rate and Rhythm: Normal rate and regular rhythm.     Pulses: Normal  pulses.          Carotid pulses are 2+ on the right side and 2+ on the left side.      Radial pulses are 2+ on the right side and 2+ on the left side.       Femoral pulses are 2+ on the right side and 2+ on the left side.      Popliteal pulses are 2+ on the right side and 2+ on the left side.       Dorsalis pedis pulses are 2+ on the right side  and 2+ on the left side.       Posterior tibial pulses are 2+ on the right side and 2+ on the left side.     Heart sounds: Normal heart sounds, S1 normal and S2 normal. No murmur heard. No systolic murmur is present.  No diastolic murmur is present.    No friction rub. No gallop. No S3 or S4 sounds.  Pulmonary:     Effort: Pulmonary effort is normal. No respiratory distress.     Breath sounds: Normal breath sounds. No stridor. No decreased breath sounds, wheezing, rhonchi or rales.  Chest:     Chest wall: No tenderness.  Breasts:    Right: Normal. No swelling, bleeding, inverted nipple, mass, nipple discharge, skin change or tenderness.     Left: Normal. No swelling, bleeding, inverted nipple, mass, nipple discharge, skin change or tenderness.  Abdominal:     General: Bowel sounds are normal.     Palpations: Abdomen is soft. There is no hepatomegaly, splenomegaly or mass.     Tenderness: There is no abdominal tenderness. There is no guarding or rebound.  Genitourinary:    Rectum: Normal. Guaiac result negative. No mass.  Musculoskeletal:        General: No tenderness. Normal range of motion.     Cervical back: Normal, normal range of motion and neck supple.     Thoracic back: Normal.     Lumbar back: Normal.     Right lower leg: No edema.     Left lower leg: No edema.  Lymphadenopathy:     Head:     Right side of head: No submental adenopathy.     Left side of head: No submental adenopathy.     Cervical: No cervical adenopathy.     Right cervical: No superficial, deep or posterior cervical adenopathy.    Left cervical: No superficial,  deep or posterior cervical adenopathy.     Upper Body:     Right upper body: No supraclavicular adenopathy.     Left upper body: No supraclavicular adenopathy.  Skin:    General: Skin is warm.     Findings: No rash.  Neurological:     Mental Status: She is alert and oriented to person, place, and time.     Cranial Nerves: Cranial nerves 2-12 are intact. No cranial nerve deficit.     Sensory: Sensation is intact.     Motor: Motor function is intact.     Deep Tendon Reflexes: Reflexes normal.  Psychiatric:        Mood and Affect: Mood is not anxious or depressed.    Wt Readings from Last 3 Encounters:  02/05/21 217 lb (98.4 kg)  07/18/20 238 lb (108 kg)  02/02/20 230 lb (104.3 kg)    BP 130/88    Pulse 88    Ht 5\' 5"  (1.651 m)    Wt 217 lb (98.4 kg)    BMI 36.11 kg/m   Assessment and Plan:  1. Annual physical exam Rachel Burton is a 66 y.o. female who presents today for her Complete Annual Exam. She feels well. She reports exercising as can. She reports she is sleeping well.  Patient's chart was reviewed for previous encounters most recent labs most recent imaging in [place].Immunizations are reviewed and recommendations provided.   Age appropriate screening tests are discussed. Counseling given for risk factor reduction interventions.  No subjective/objective concerns noted during history and physical exam as well as review of systems. - POCT Occult  Blood Stool  2. Breast cancer screening by mammogram Breast exam is normal.  We will do mammogram for screening purposes. - MM 3D SCREEN BREAST BILATERAL  3. Primary insomnia Chronic.  Controlled.  Stable.  Continue trazodone 100 mg daily. - traZODone (DESYREL) 100 MG tablet; Take 1 tablet (100 mg total) by mouth at bedtime.  Dispense: 90 tablet; Refill: 1  4. Familial hypercholesterolemia Chronic.  Controlled.  Stable.  Continue lipid panel evaluation and stress low-cholesterol dietary guidelines - Lipid Panel With  LDL/HDL Ratio - Comprehensive Metabolic Panel (CMET)  5. Recurrent major depressive disorder, in partial remission (HCC) Chronic.  Controlled.  Stable.  PHQ is 0 Gad score is 0 continue bupropion 100 mg 1 twice a day. - buPROPion (WELLBUTRIN) 100 MG tablet; Take 1 tablet (100 mg total) by mouth 2 (two) times daily.  Dispense: 180 tablet; Refill: 1  6. Need for pneumococcal vaccination Discussed and administered - Pneumococcal conjugate vaccine 20-valent (Prevnar 20)

## 2021-02-06 LAB — COMPREHENSIVE METABOLIC PANEL
ALT: 19 IU/L (ref 0–32)
AST: 24 IU/L (ref 0–40)
Albumin/Globulin Ratio: 1.6 (ref 1.2–2.2)
Albumin: 4.2 g/dL (ref 3.8–4.8)
Alkaline Phosphatase: 121 IU/L (ref 44–121)
BUN/Creatinine Ratio: 16 (ref 12–28)
BUN: 14 mg/dL (ref 8–27)
Bilirubin Total: 0.3 mg/dL (ref 0.0–1.2)
CO2: 24 mmol/L (ref 20–29)
Calcium: 9.6 mg/dL (ref 8.7–10.3)
Chloride: 101 mmol/L (ref 96–106)
Creatinine, Ser: 0.89 mg/dL (ref 0.57–1.00)
Globulin, Total: 2.6 g/dL (ref 1.5–4.5)
Glucose: 88 mg/dL (ref 70–99)
Potassium: 4.7 mmol/L (ref 3.5–5.2)
Sodium: 144 mmol/L (ref 134–144)
Total Protein: 6.8 g/dL (ref 6.0–8.5)
eGFR: 71 mL/min/{1.73_m2} (ref >59)

## 2021-02-06 LAB — LIPID PANEL WITH LDL/HDL RATIO
Cholesterol, Total: 197 mg/dL (ref 100–199)
HDL: 82 mg/dL (ref >39)
LDL Chol Calc (NIH): 104 mg/dL — ABNORMAL HIGH (ref 0–99)
LDL/HDL Ratio: 1.3 ratio (ref 0.0–3.2)
Triglycerides: 60 mg/dL (ref 0–149)
VLDL Cholesterol Cal: 11 mg/dL (ref 5–40)

## 2021-03-19 ENCOUNTER — Other Ambulatory Visit: Payer: Self-pay

## 2021-03-19 ENCOUNTER — Ambulatory Visit
Admission: RE | Admit: 2021-03-19 | Discharge: 2021-03-19 | Disposition: A | Discharge status: Home / Self Care | Payer: Medicare Other | Source: Ambulatory Visit | Attending: Family Medicine | Admitting: Family Medicine

## 2021-03-19 DIAGNOSIS — Z1231 Encounter for screening mammogram for malignant neoplasm of breast: Secondary | ICD-10-CM | POA: Diagnosis not present

## 2021-08-05 ENCOUNTER — Ambulatory Visit (INDEPENDENT_AMBULATORY_CARE_PROVIDER_SITE_OTHER): Payer: Medicare Other | Admitting: Family Medicine

## 2021-08-05 ENCOUNTER — Encounter: Payer: Self-pay | Admitting: Family Medicine

## 2021-08-05 VITALS — BP 130/88 | HR 76 | Ht 65.0 in | Wt 232.0 lb

## 2021-08-05 DIAGNOSIS — F5101 Primary insomnia: Secondary | ICD-10-CM | POA: Diagnosis not present

## 2021-08-05 DIAGNOSIS — F3341 Major depressive disorder, recurrent, in partial remission: Secondary | ICD-10-CM

## 2021-08-05 MED ORDER — TRAZODONE HCL 100 MG PO TABS: 100.0000 mg | ORAL_TABLET | Freq: Every day | ORAL | 1 refills | Status: DC

## 2021-08-05 MED ORDER — BUPROPION HCL 100 MG PO TABS: 100.0000 mg | ORAL_TABLET | Freq: Two times a day (BID) | ORAL | 1 refills | Status: DC

## 2021-08-05 NOTE — Progress Notes (Signed)
Date:  08/05/2021   Name:  Rachel Burton   DOB:  [birth year]   MRN:  [id number]   Chief Complaint: Depression and Insomnia  Depression        This is a chronic problem.  The current episode started more than 1 year ago.   The onset quality is gradual.   The problem occurs daily.  The problem has been gradually improving since onset.  Associated symptoms include fatigue, insomnia, decreased interest and sad.  Associated symptoms include no decreased concentration, no helplessness, no hopelessness, not irritable, no restlessness, no appetite change, no body aches, no myalgias, no headaches, no indigestion and no suicidal ideas.     The symptoms are aggravated by nothing.  Past treatments include SSRIs - Selective serotonin reuptake inhibitors.  Compliance with treatment is good.  Previous treatment provided moderate relief. Insomnia Primary symptoms: difficulty falling asleep.   The current episode started more than one year. The problem occurs intermittently. The problem has been waxing and waning since onset. The treatment provided moderate relief. PMH includes: depression.     Lab Results  Component Value Date   NA 144 02/05/2021   K 4.7 02/05/2021   CO2 24 02/05/2021   GLUCOSE 88 02/05/2021   BUN 14 02/05/2021   CREATININE 0.89 02/05/2021   CALCIUM 9.6 02/05/2021   EGFR 71 02/05/2021   GFRNONAA 83 02/02/2020   Lab Results  Component Value Date   CHOL 197 02/05/2021   HDL 82 02/05/2021   LDLCALC 104 (H) 02/05/2021   TRIG 60 02/05/2021   CHOLHDL 2.7 01/28/2018   No results found for: "TSH" No results found for: "HGBA1C" No results found for: "WBC", "HGB", "HCT", "MCV", "PLT" Lab Results  Component Value Date   ALT 19 02/05/2021   AST 24 02/05/2021   ALKPHOS 121 02/05/2021   BILITOT 0.3 02/05/2021   No results found for: "25OHVITD2", "25OHVITD3", "VD25OH"   Review of Systems  Constitutional:  Positive for fatigue. Negative for appetite change, chills and fever.   HENT:  Negative for drooling, ear discharge, ear pain and sore throat.   Respiratory:  Negative for cough, shortness of breath and wheezing.   Cardiovascular:  Negative for chest pain, palpitations and leg swelling.  Gastrointestinal:  Negative for abdominal pain, blood in stool, constipation, diarrhea and nausea.  Endocrine: Negative for polydipsia.  Genitourinary:  Negative for dysuria, frequency, hematuria and urgency.  Musculoskeletal:  Negative for back pain, myalgias and neck pain.  Skin:  Negative for rash.  Allergic/Immunologic: Negative for environmental allergies.  Neurological:  Negative for dizziness and headaches.  Hematological:  Does not bruise/bleed easily.  Psychiatric/Behavioral:  Positive for depression. Negative for decreased concentration and suicidal ideas. The patient has insomnia. The patient is not nervous/anxious.     Patient Active Problem List   Diagnosis Date Noted   Post-menopausal 02/02/2019   Insomnia 09/28/2015   Depression 09/28/2015    Allergies  Allergen Reactions   Levofloxacin Other (See Comments)   Zolpidem Other (See Comments)    Past Surgical History:  Procedure Laterality Date   COLONOSCOPY  2010   cleared for 10 years    Social History   Tobacco Use   Smoking status: Never   Smokeless tobacco: Never  Vaping Use   Vaping Use: Never used  Substance Use Topics   Alcohol use: No   Drug use: No     Medication list has been reviewed and updated.  Current Meds  Medication Sig  buPROPion (WELLBUTRIN) 100 MG tablet Take 1 tablet (100 mg total) by mouth 2 (two) times daily.   traZODone (DESYREL) 100 MG tablet Take 1 tablet (100 mg total) by mouth at bedtime.       08/05/2021   10:10 AM 02/05/2021    8:45 AM 07/18/2020    8:34 AM 02/02/2020    8:39 AM  GAD 7 : Generalized Anxiety Score  Nervous, Anxious, on Edge 0 0 0 0  Control/stop worrying 0 0 0 0  Worry too much - different things 0 0 0 0  Trouble relaxing 0 0 0 0   Restless 0 0 0 0  Easily annoyed or irritable 0 0 0 0  Afraid - awful might happen 0 0 0 0  Total GAD 7 Score 0 0 0 0  Anxiety Difficulty Not difficult at all Not difficult at all         08/05/2021   10:09 AM 02/05/2021    8:45 AM 10/01/2020    1:24 PM  Depression screen PHQ 2/9  Decreased Interest 1 0 1  Down, Depressed, Hopeless 1 0 1  PHQ - 2 Score 2 0 2  Altered sleeping 2 0 1  Tired, decreased energy 1 0 0  Change in appetite 0 0 0  Feeling bad or failure about yourself  0 0 0  Trouble concentrating 0 0 0  Moving slowly or fidgety/restless 0 0 0  Suicidal thoughts 0 0 0  PHQ-9 Score 5 0 3  Difficult doing work/chores Not difficult at all Not difficult at all Not difficult at all    BP Readings from Last 3 Encounters:  08/05/21 130/88  02/05/21 130/88  07/18/20 138/86    Physical Exam Vitals and nursing note reviewed. Exam conducted with a chaperone present.  Constitutional:      General: She is not irritable.She is not in acute distress.    Appearance: She is not diaphoretic.  HENT:     Head: Normocephalic and atraumatic.     Right Ear: External ear normal.     Left Ear: External ear normal.     Nose: Nose normal.     Mouth/Throat:     Mouth: Mucous membranes are moist.  Eyes:     General:        Right eye: No discharge.        Left eye: No discharge.     Conjunctiva/sclera: Conjunctivae normal.     Pupils: Pupils are equal, round, and reactive to light.  Neck:     Thyroid: No thyromegaly.     Vascular: No JVD.  Cardiovascular:     Rate and Rhythm: Normal rate and regular rhythm.     Heart sounds: Normal heart sounds. No murmur heard.    No friction rub. No gallop.  Pulmonary:     Effort: Pulmonary effort is normal.     Breath sounds: Normal breath sounds. No wheezing, rhonchi or rales.  Abdominal:     General: Bowel sounds are normal.     Palpations: Abdomen is soft. There is no mass.     Tenderness: There is no abdominal tenderness. There is no  guarding.  Musculoskeletal:        General: Normal range of motion.     Cervical back: Normal range of motion and neck supple.  Lymphadenopathy:     Cervical: No cervical adenopathy.  Skin:    General: Skin is warm and dry.  Neurological:     Mental Status:  She is alert.     Deep Tendon Reflexes: Reflexes are normal and symmetric.     Wt Readings from Last 3 Encounters:  08/05/21 232 lb (105.2 kg)  02/05/21 217 lb (98.4 kg)  07/18/20 238 lb (108 kg)    BP 130/88   Pulse 76   Ht _0  (1.651 m)   Wt 232 lb (105.2 kg)   BMI 38.61 kg/m   Assessment and Plan: 1. Recurrent major depressive disorder, in partial remission (HCC) Chronic.  Controlled.  Stable.  Asymptomatic except for fatigue some sadness some decrease in things of interest.  We will continue bupropion 100 mg twice a day.  We will recheck patient in 6 months. - buPROPion (WELLBUTRIN) 100 MG tablet; Take 1 tablet (100 mg total) by mouth 2 (two) times daily.  Dispense: 180 tablet; Refill: 1  2. Primary insomnia Chronic.  Partially controlled.  Relatively stable.  Mostly good nights but there are occasional bad nights that she does not get right to sleep.  Patient was unable to increase beyond 100 mg and will continue at trazodone 100 mg once nightly as needed.  We will recheck patient in 6 months. - traZODone (DESYREL) 100 MG tablet; Take 1 tablet (100 mg total) by mouth at bedtime.  Dispense: 90 tablet; Refill: 1

## 2021-09-20 DIAGNOSIS — H2513 Age-related nuclear cataract, bilateral: Secondary | ICD-10-CM | POA: Diagnosis not present

## 2021-09-20 DIAGNOSIS — H40003 Preglaucoma, unspecified, bilateral: Secondary | ICD-10-CM | POA: Diagnosis not present

## 2021-10-02 ENCOUNTER — Ambulatory Visit (INDEPENDENT_AMBULATORY_CARE_PROVIDER_SITE_OTHER): Payer: Medicare Other

## 2021-10-02 VITALS — Ht 65.0 in | Wt 232.0 lb

## 2021-10-02 DIAGNOSIS — Z0000 Encounter for general adult medical examination without abnormal findings: Secondary | ICD-10-CM

## 2021-10-02 NOTE — Progress Notes (Signed)
Subjective:   Rachel Burton is a 66 y.o. female who presents for Medicare Annual (Subsequent) preventive examination.  I connected with  Rachel Burton on 10/02/21 by a audio enabled telemedicine application and verified that I am speaking with the correct person using two identifiers.  Patient Location: Home  Provider Location: Office/Clinic  I discussed the limitations of evaluation and management by telemedicine. The patient expressed understanding and agreed to proceed.   Review of Systems    Defer to PCP Cardiac Risk Factors include: advanced age (>85mn, >>74women);obesity (BMI >30kg/m2)     Objective:    Today's Vitals   10/02/21 1318 10/02/21 1324  Weight: 232 lb (105.2 kg)   Height: '5\' 5"'  (1.651 m)   PainSc: 0-No pain 0-No pain   Body mass index is 38.61 kg/m.     10/02/2021    1:25 PM 10/01/2020    1:27 PM 09/28/2019    1:33 PM  Advanced Directives  Does Patient Have a Medical Advance Directive? Yes No Yes  Type of Advance Directive Living will    Does patient want to make changes to medical advance directive?   Yes (MAU/Ambulatory/Procedural Areas - Information given)  Would patient like information on creating a medical advance directive?  No - Patient declined     Current Medications (verified) Outpatient Encounter Medications as of 10/02/2021  Medication Sig   buPROPion (WELLBUTRIN) 100 MG tablet Take 1 tablet (100 mg total) by mouth 2 (two) times daily.   traZODone (DESYREL) 100 MG tablet Take 1 tablet (100 mg total) by mouth at bedtime.   No facility-administered encounter medications on file as of 10/02/2021.    Allergies (verified) Levofloxacin and Zolpidem   History: Past Medical History:  Diagnosis Date   Allergy    Depression    Insomnia    Past Surgical History:  Procedure Laterality Date   COLONOSCOPY  2010   cleared for 10 years   Family History  Problem Relation Age of Onset   Breast cancer Mother 56  Cancer Mother    Heart  disease Maternal Grandmother    Hypertension Maternal Grandmother    Heart disease Maternal Grandfather    Social History   Socioeconomic History   Marital status: Widowed    Spouse name: Not on file   Number of children: 0   Years of education: Not on file   Highest education level: Not on file  Occupational History   Not on file  Tobacco Use   Smoking status: Never   Smokeless tobacco: Never  Vaping Use   Vaping Use: Never used  Substance and Sexual Activity   Alcohol use: No   Drug use: No   Sexual activity: Not Currently    Birth control/protection: Post-menopausal  Other Topics Concern   Not on file  Social History Narrative   Pt lives alone   Social Determinants of Health   Financial Resource Strain: Low Risk  (10/02/2021)   Overall Financial Resource Strain (CARDIA)    Difficulty of Paying Living Expenses: Not hard at all  Food Insecurity: No Food Insecurity (10/02/2021)   Hunger Vital Sign    Worried About Running Out of Food in the Last Year: Never true    Ran Out of Food in the Last Year: Never true  Transportation Needs: No Transportation Needs (10/01/2020)   PRAPARE - THydrologist(Medical): No    Lack of Transportation (Non-Medical): No  Physical Activity: Sufficiently Active (10/02/2021)  Exercise Vital Sign    Days of Exercise per Week: 7 days    Minutes of Exercise per Session: 60 min  Stress: No Stress Concern Present (10/02/2021)   [place]    Feeling of Stress : Not at all  Social Connections: Moderately Isolated (10/02/2021)   Social Connection and Isolation Panel [NHANES]    Frequency of Communication with Friends and Family: More than three times a week    Frequency of Social Gatherings with Friends and Family: More than three times a week    Attends Religious Services: Never    Marine scientist or Organizations: Yes    Attends Programme researcher, broadcasting/film/video: More than 4 times per year    Marital Status: Widowed    Tobacco Counseling Counseling given: Not Answered   Clinical Intake:  Pre-visit preparation completed: Yes  Pain : No/denies pain Pain Score: 0-No pain     BMI - recorded: 38.61 Nutritional Status: BMI > 30  Obese Nutritional Risks: None Diabetes: No     Diabetic? No.     Information entered by :: Rachel Burton, [place] of Daily Living    10/02/2021    1:26 PM  In your present state of health, do you have any difficulty performing the following activities:  Hearing? 0  Vision? 0  Difficulty concentrating or making decisions? 0  Walking or climbing stairs? 0  Dressing or bathing? 0  Doing errands, shopping? 0  Preparing Food and eating ? N  Using the Toilet? N  In the past six months, have you accidently leaked urine? N  Do you have problems with loss of bowel control? N  Managing your Medications? N  Managing your Finances? N  Housekeeping or managing your Housekeeping? N    Patient Care Team: Rachel Patch, MD as PCP - General (Family Medicine)  Indicate any recent Medical Services you may have received from other than [facility] providers in the past year (date may be approximate).     Assessment:   This is a routine wellness examination for Rachel Burton.  Hearing/Vision screen Hearing Screening - Comments:: No concerns. Vision Screening - Comments:: Prescription glasses.  Dietary issues and exercise activities discussed: Current Exercise Habits: The patient does not participate in regular exercise at present   Goals Addressed   None   Depression Screen    10/02/2021    1:25 PM 08/05/2021   10:09 AM 02/05/2021    8:45 AM 10/01/2020    1:24 PM 07/18/2020    8:34 AM 02/02/2020    8:39 AM 09/28/2019    1:32 PM  PHQ 2/9 Scores  PHQ - 2 Score 0 2 0 2 0 0 0  PHQ- 9 Score 6 5 0 3 0 0     Fall Risk    10/02/2021    1:25 PM 08/05/2021   10:09 AM 02/05/2021    8:45 AM  10/01/2020    1:28 PM 07/18/2020    8:33 AM  Fall Risk   Falls in the past year? 0 1 0 0 0  Number falls in past yr: 0 1 0 0 0  Injury with Fall? 0 0 0 0 0  Risk for fall due to : History of fall(s) No Fall Risks No Fall Risks No Fall Risks No Fall Risks  Follow up Falls evaluation completed Falls evaluation completed Falls evaluation completed Falls prevention discussed Falls evaluation completed    FALL  RISK PREVENTION PERTAINING TO THE HOME:  Any stairs in or around the home? No  If so, are there any without handrails? No  Home free of loose throw rugs in walkways, pet beds, electrical cords, etc? Yes  Adequate lighting in your home to reduce risk of falls? Yes   ASSISTIVE DEVICES UTILIZED TO PREVENT FALLS:  Life alert? No  Use of a cane, walker or w/c? No  Grab bars in the bathroom? Yes  Shower chair or bench in shower? No  Elevated toilet seat or a handicapped toilet? No   Cognitive Function:        10/02/2021    1:26 PM 09/28/2019    1:35 PM  6CIT Screen  What Year? 0 points 0 points  What month? 0 points 0 points  What time? 0 points 0 points  Count back from 20 2 points 2 points  Months in reverse 0 points 0 points  Repeat phrase 2 points 0 points  Total Score 4 points 2 points    Immunizations Immunization History  Administered Date(s) Administered   DTaP 05/07/2011   Fluad Quad(high Dose 65+) 10/14/2019   Influenza,inj,Quad PF,6+ Mos 11/02/2017   Influenza-Unspecified 11/07/2011, 11/02/2017, 10/15/2018, 10/10/2020   MMR 02/07/2011   Moderna Sars-Covid-2 Vaccination 10/10/2020   PFIZER(Purple Top)SARS-COV-2 Vaccination 02/17/2019, 03/15/2019, 01/04/2020, 06/02/2020   PNEUMOCOCCAL CONJUGATE-20 02/05/2021   Pneumococcal Conjugate-13 10/14/2019   Td (Adult) 05/07/2011   Varicella 02/07/2011    TDAP status: Due, Education has been provided regarding the importance of this vaccine. Advised may receive this vaccine at local pharmacy or Health Dept. Aware to  provide a copy of the vaccination record if obtained from local pharmacy or Health Dept. Verbalized acceptance and understanding.  Flu Vaccine status: Due, Education has been provided regarding the importance of this vaccine. Advised may receive this vaccine at local pharmacy or Health Dept. Aware to provide a copy of the vaccination record if obtained from local pharmacy or Health Dept. Verbalized acceptance and understanding.  Pneumococcal vaccine status: Up to date  Covid-19 vaccine status: Completed vaccines  Qualifies for Shingles Vaccine? Yes   Zostavax completed No   Shingrix Completed?: No.    Education has been provided regarding the importance of this vaccine. Patient has been advised to call insurance company to determine out of pocket expense if they have not yet received this vaccine. Advised may also receive vaccine at local pharmacy or Health Dept. Verbalized acceptance and understanding.  Screening Tests Health Maintenance  Topic Date Due   COVID-19 Vaccine (6 - Pfizer series) 10/18/2021 (Originally 12/05/2020)   Zoster Vaccines- Shingrix (1 of 2) 11/05/2021 (Originally 04/22/2003)   INFLUENZA VACCINE  04/06/2022 (Originally 08/06/2021)   TETANUS/TDAP  01/07/2023   MAMMOGRAM  03/20/2023   Pneumonia Vaccine 20+ Years old  Completed   DEXA SCAN  Completed   Hepatitis C Screening  Completed   HPV VACCINES  Aged Out   COLONOSCOPY (Pts 45-36yr Insurance coverage will need to be confirmed)  Discontinued    Health Maintenance  There are no preventive care reminders to display for this patient.  Colorectal Screening: Patient declines screening.  Mammogram status: Completed 03/19/2021. Repeat every year  Bone Density status: Completed 02/09/2019. Results reflect: Bone density results: NORMAL. Repeat every 3 years.  Lung Cancer Screening: (Low Dose CT Chest recommended if Age 68-80years, 30 pack-year currently smoking OR have quit w/in 15years.) does not qualify.   Lung  Cancer Screening Referral: N/A  Additional Screening:  Hepatitis C Screening:  does qualify; Completed 04/28/2016  Vision Screening: Recommended annual ophthalmology exams for early detection of glaucoma and other disorders of the eye. Is the patient up to date with their annual eye exam?  Yes  Who is the provider or what is the name of the office in which the patient attends annual eye exams? Dr Edison Pace [facility] If pt is not established with a provider, would they like to be referred to a provider to establish care?  N/A .   Dental Screening: Recommended annual dental exams for proper oral hygiene  Community Resource Referral / Chronic Care Management: CRR required this visit?  No   CCM required this visit?  No      Plan:     I have personally reviewed and noted the following in the patient's chart:   Medical and social history Use of alcohol, tobacco or illicit drugs  Current medications and supplements including opioid prescriptions. Patient is not currently taking opioid prescriptions. Functional ability and status Nutritional status Physical activity Advanced directives List of other physicians Hospitalizations, surgeries, and ER visits in previous 12 months Vitals Screenings to include cognitive, depression, and falls Referrals and appointments  In addition, I have reviewed and discussed with patient certain preventive protocols, quality metrics, and best practice recommendations. A written personalized care plan for preventive services as well as general preventive health recommendations were provided to patient.     Clista Bernhardt, [place]   10/02/2021     Ms. Galluzzo , Thank you for taking time to come for your Medicare Wellness Visit. I appreciate your ongoing commitment to your health goals. Please review the following plan we discussed and let me know if I can assist you in the future.   These are the goals we discussed:  Goals      Weight (lb) <  200 lb (90.7 kg)     Pt would like to lose weight over the next year with healthy eating and physical activity        This is a list of the screening recommended for you and due dates:  Health Maintenance  Topic Date Due   COVID-19 Vaccine (6 - Pfizer series) 10/18/2021*   Zoster (Shingles) Vaccine (1 of 2) 11/05/2021*   Flu Shot  04/06/2022*   Tetanus Vaccine  01/07/2023   Mammogram  03/20/2023   Pneumonia Vaccine  Completed   DEXA scan (bone density measurement)  Completed   Hepatitis C Screening: USPSTF Recommendation to screen - Ages 15-79 yo.  Completed   HPV Vaccine  Aged Out   Colon Cancer Screening  Discontinued  *Topic was postponed. The date shown is not the original due date.      Nurse Notes: FYI - having cataract surgery next month on both eyes.

## 2021-10-16 DIAGNOSIS — H2511 Age-related nuclear cataract, right eye: Secondary | ICD-10-CM | POA: Diagnosis not present

## 2021-10-22 ENCOUNTER — Encounter: Payer: Self-pay | Admitting: Ophthalmology

## 2021-10-25 NOTE — Anesthesia Preprocedure Evaluation (Addendum)
Anesthesia Evaluation  Patient identified by MRN, date of birth, ID band Patient awake    Reviewed: Allergy & Precautions, NPO status , Patient's Chart, lab work & pertinent test results  Airway Mallampati: III  TM Distance: >3 FB Neck ROM: full    Dental no notable dental hx.    Pulmonary neg pulmonary ROS,    Pulmonary exam normal        Cardiovascular negative cardio ROS Normal cardiovascular exam     Neuro/Psych PSYCHIATRIC DISORDERS Depression negative neurological ROS     GI/Hepatic negative GI ROS, Neg liver ROS,   Endo/Other  negative endocrine ROS  Renal/GU      Musculoskeletal   Abdominal (+) + obese,   Peds  Hematology negative hematology ROS (+)   Anesthesia Other Findings Elevated blood pressure today. No signs of end-organ damage. Denies CP, SOB, Vision changes  Past Medical History: No date: Allergy No date: Depression No date: Insomnia No date: Motion sickness     Comment:  car - back seat  Past Surgical History: 2010: COLONOSCOPY     Comment:  cleared for 10 years  BMI    Body Mass Index: 35.35 kg/m      Reproductive/Obstetrics negative OB ROS                            Anesthesia Physical Anesthesia Plan  ASA: 2  Anesthesia Plan: MAC   Post-op Pain Management:    Induction: Intravenous  PONV Risk Score and Plan:   Airway Management Planned: Natural Airway  Additional Equipment:   Intra-op Plan:   Post-operative Plan:   Informed Consent: I have reviewed the patients History and Physical, chart, labs and discussed the procedure including the risks, benefits and alternatives for the proposed anesthesia with the patient or authorized representative who has indicated his/her understanding and acceptance.     Dental advisory given  Plan Discussed with: Anesthesiologist, CRNA and Surgeon  Anesthesia Plan Comments:        Anesthesia Quick  Evaluation

## 2021-10-25 NOTE — Discharge Instructions (Signed)

## 2021-10-26 ENCOUNTER — Other Ambulatory Visit: Payer: Self-pay | Admitting: Family Medicine

## 2021-10-26 DIAGNOSIS — F3341 Major depressive disorder, recurrent, in partial remission: Secondary | ICD-10-CM

## 2021-10-28 ENCOUNTER — Ambulatory Visit: Payer: Medicare Other | Admitting: General Practice

## 2021-10-28 ENCOUNTER — Encounter
Admission: RE | Disposition: A | Discharge status: Home / Self Care | Payer: Self-pay | Source: Ambulatory Visit | Attending: Ophthalmology

## 2021-10-28 ENCOUNTER — Other Ambulatory Visit: Payer: Self-pay

## 2021-10-28 ENCOUNTER — Ambulatory Visit
Admission: RE | Admit: 2021-10-28 | Discharge: 2021-10-28 | Disposition: A | Discharge status: Home / Self Care | Payer: Medicare Other | Source: Ambulatory Visit | Attending: Ophthalmology | Admitting: Ophthalmology

## 2021-10-28 DIAGNOSIS — F32A Depression, unspecified: Secondary | ICD-10-CM | POA: Diagnosis not present

## 2021-10-28 DIAGNOSIS — H2511 Age-related nuclear cataract, right eye: Secondary | ICD-10-CM | POA: Insufficient documentation

## 2021-10-28 HISTORY — PX: CATARACT EXTRACTION W/PHACO: SHX586

## 2021-10-28 HISTORY — DX: Motion sickness, initial encounter: T75.3XXA

## 2021-10-28 SURGERY — PHACOEMULSIFICATION, CATARACT, WITH IOL INSERTION
Anesthesia: Monitor Anesthesia Care | Site: Eye | Laterality: Right

## 2021-10-28 MED ORDER — ARMC OPHTHALMIC DILATING DROPS
1.0000 | OPHTHALMIC | Status: AC | PRN
  Administered 2021-10-28 (×3): 1 via OPHTHALMIC

## 2021-10-28 MED ORDER — SIGHTPATH DOSE#1 BSS IO SOLN
INTRAOCULAR | Status: DC | PRN
  Administered 2021-10-28: 15 mL

## 2021-10-28 MED ORDER — MIDAZOLAM HCL 2 MG/2ML IJ SOLN
INTRAMUSCULAR | Status: DC | PRN
  Administered 2021-10-28: 2 mg via INTRAVENOUS

## 2021-10-28 MED ORDER — LIDOCAINE HCL (PF) 2 % IJ SOLN
INTRAOCULAR | Status: DC | PRN
  Administered 2021-10-28: 1 mL via INTRAOCULAR

## 2021-10-28 MED ORDER — FENTANYL CITRATE (PF) 100 MCG/2ML IJ SOLN
INTRAMUSCULAR | Status: DC | PRN
  Administered 2021-10-28: 100 ug via INTRAVENOUS

## 2021-10-28 MED ORDER — MOXIFLOXACIN HCL 0.5 % OP SOLN
OPHTHALMIC | Status: DC | PRN
  Administered 2021-10-28: 0.2 mL via OPHTHALMIC

## 2021-10-28 MED ORDER — SIGHTPATH DOSE#1 NA HYALUR & NA CHOND-NA HYALUR IO KIT
PACK | INTRAOCULAR | Status: DC | PRN
  Administered 2021-10-28: 1 via OPHTHALMIC

## 2021-10-28 MED ORDER — SIGHTPATH DOSE#1 BSS IO SOLN
INTRAOCULAR | Status: DC | PRN
  Administered 2021-10-28: 85 mL via OPHTHALMIC

## 2021-10-28 MED ORDER — LABETALOL HCL 5 MG/ML IV SOLN
10.0000 mg | INTRAVENOUS | Status: DC | PRN
  Administered 2021-10-28: 10 mg via INTRAVENOUS

## 2021-10-28 MED ORDER — TETRACAINE HCL 0.5 % OP SOLN
1.0000 [drp] | OPHTHALMIC | Status: AC | PRN
  Administered 2021-10-28 (×3): 1 [drp] via OPHTHALMIC

## 2021-10-28 SURGICAL SUPPLY — 19 items
CANNULA ANT/CHMB 27G (MISCELLANEOUS) IMPLANT
CANNULA ANT/CHMB 27GA (MISCELLANEOUS) IMPLANT
CATARACT SUITE SIGHTPATH (MISCELLANEOUS) ×1 IMPLANT
DISSECTOR HYDRO NUCLEUS 50X22 (MISCELLANEOUS) ×1 IMPLANT
FEE CATARACT SUITE SIGHTPATH (MISCELLANEOUS) ×1 IMPLANT
GLOVE SURG GAMMEX PI TX LF 7.5 (GLOVE) ×1 IMPLANT
GLOVE SURG SYN 8.5  E (GLOVE) ×1
GLOVE SURG SYN 8.5 E (GLOVE) ×1 IMPLANT
GLOVE SURG SYN 8.5 PF PI (GLOVE) ×1 IMPLANT
LENS IOL TECNIS EYHANCE 19.5 (Intraocular Lens) IMPLANT
NDL FILTER BLUNT 18X1 1/2 (NEEDLE) ×1 IMPLANT
NEEDLE FILTER BLUNT 18X1 1/2 (NEEDLE) ×1 IMPLANT
PACK VIT ANT 23G (MISCELLANEOUS) IMPLANT
RING MALYGIN (MISCELLANEOUS) IMPLANT
SUT ETHILON 10-0 CS-B-6CS-B-6 (SUTURE)
SUTURE EHLN 10-0 CS-B-6CS-B-6 (SUTURE) IMPLANT
SYR 3ML LL SCALE MARK (SYRINGE) ×1 IMPLANT
SYR 5ML LL (SYRINGE) ×1 IMPLANT
WATER STERILE IRR 250ML POUR (IV SOLUTION) ×1 IMPLANT

## 2021-10-28 NOTE — H&P (Signed)
[facility]   Primary Care Physician:  Juline Patch, MD Ophthalmologist: Dr. Benay Pillow  Pre-Procedure History & Physical: HPI:  Rachel Burton is a 66 y.o. female here for cataract surgery.   Past Medical History:  Diagnosis Date   Allergy    Depression    Insomnia    Motion sickness    car - back seat    Past Surgical History:  Procedure Laterality Date   COLONOSCOPY  2010   cleared for 10 years    Prior to Admission medications   Medication Sig Start Date End Date Taking? Authorizing Provider  buPROPion (WELLBUTRIN) 100 MG tablet Take 1 tablet (100 mg total) by mouth 2 (two) times daily. 08/05/21  Yes Juline Patch, MD  ibuprofen (ADVIL) 400 MG tablet Take 400 mg by mouth every 6 (six) hours as needed for moderate pain.   Yes [provider]  Multiple Vitamin (MULTIVITAMIN) tablet Take 1 tablet by mouth daily.   Yes [provider]  traZODone (DESYREL) 100 MG tablet Take 1 tablet (100 mg total) by mouth at bedtime. 08/05/21  Yes Juline Patch, MD    Allergies as of 09/25/2021 - Review Complete 08/05/2021  Allergen Reaction Noted   Levofloxacin Other (See Comments) 09/28/2015   Zolpidem Other (See Comments) 09/28/2015    Family History  Problem Relation Age of Onset   Breast cancer Mother 79   Cancer Mother    Heart disease Maternal Grandmother    Hypertension Maternal Grandmother    Heart disease Maternal Grandfather     Social History   Socioeconomic History   Marital status: Widowed    Spouse name: Not on file   Number of children: 0   Years of education: Not on file   Highest education level: Not on file  Occupational History   Not on file  Tobacco Use   Smoking status: Never   Smokeless tobacco: Never  Vaping Use   Vaping Use: Never used  Substance and Sexual Activity   Alcohol use: No   Drug use: No   Sexual activity: Not Currently    Birth control/protection: Post-menopausal  Other Topics Concern   Not on  file  Social History Narrative   Pt lives alone   Social Determinants of Health   Financial Resource Strain: Low Risk  (10/02/2021)   Overall Financial Resource Strain (CARDIA)    Difficulty of Paying Living Expenses: Not hard at all  Food Insecurity: No Food Insecurity (10/02/2021)   Hunger Vital Sign    Worried About Running Out of Food in the Last Year: Never true    Ran Out of Food in the Last Year: Never true  Transportation Needs: No Transportation Needs (10/01/2020)   PRAPARE - Hydrologist (Medical): No    Lack of Transportation (Non-Medical): No  Physical Activity: Sufficiently Active (10/02/2021)   Exercise Vital Sign    Days of Exercise per Week: 7 days    Minutes of Exercise per Session: 60 min  Stress: No Stress Concern Present (10/02/2021)   [place]    Feeling of Stress : Not at all  Social Connections: Moderately Isolated (10/02/2021)   Social Connection and Isolation Panel [NHANES]    Frequency of Communication with Friends and Family: More than three times a week    Frequency of Social Gatherings with Friends and Family: More than three times a week    Attends Religious  Services: Never    Active Member of Clubs or Organizations: Yes    Attends Banker Meetings: More than 4 times per year    Marital Status: Widowed  Intimate Partner Violence: Not At Risk (10/02/2021)   Humiliation, Afraid, Rape, and Kick questionnaire    Fear of Current or Ex-Partner: No    Emotionally Abused: No    Physically Abused: No    Sexually Abused: No    Review of Systems: See HPI, otherwise negative ROS  Physical Exam: BP (!) 162/94   Pulse 78   Temp 98.1 F (36.7 C) (Temporal)   Resp 16   Ht 5\' 6"  (1.676 m)   Wt 101.6 kg   SpO2 100%   BMI 36.15 kg/m  General:   Alert, cooperative in NAD Head:  Normocephalic and atraumatic. Respiratory:  Normal work of  breathing. Cardiovascular:  RRR  Impression/Plan: Rachel Burton is here for cataract surgery.  Risks, benefits, limitations, and alternatives regarding cataract surgery have been reviewed with the patient.  Questions have been answered.  All parties agreeable.   Clerance Lav, MD  10/28/2021, 11:35 AM

## 2021-10-28 NOTE — Op Note (Signed)
OPERATIVE NOTE  Rachel Burton [id number] 10/28/2021   PREOPERATIVE DIAGNOSIS:  Nuclear sclerotic cataract right eye.  H25.11   POSTOPERATIVE DIAGNOSIS:    Nuclear sclerotic cataract right eye.     PROCEDURE:  Phacoemusification with posterior chamber intraocular lens placement of the right eye   LENS:   Implant Name Type Inv. Item Serial No. Manufacturer Lot No. LRB No. Used Action  LENS IOL TECNIS EYHANCE 19.5 - [id number] Intraocular Lens LENS IOL TECNIS EYHANCE 19.5 [id number] [employer]  Right 1 Implanted       Procedure(s) with comments: CATARACT EXTRACTION PHACO AND INTRAOCULAR LENS PLACEMENT (IOC) RIGHT (Right) - 9.63 0:48.5  DIB00 +19.5 SURGEON:  Benay Pillow, MD, MPH  ANESTHESIOLOGIST: Anesthesiologist: Iran Ouch, MD CRNA: Tobie Poet, CRNA   ANESTHESIA:  Topical with tetracaine drops augmented with 1% preservative-free intracameral lidocaine.  ESTIMATED BLOOD LOSS: less than 1 mL.   COMPLICATIONS:  None.   DESCRIPTION OF PROCEDURE:  The patient was identified in the holding room and transported to the operating room and placed in the supine position under the operating microscope.  The right eye was identified as the operative eye and it was prepped and draped in the usual sterile ophthalmic fashion.   A 1.0 millimeter clear-corneal paracentesis was made at the 10:30 position. 0.5 ml of preservative-free 1% lidocaine with epinephrine was injected into the anterior chamber.  The anterior chamber was filled with viscoelastic.  A 2.4 millimeter keratome was used to make a near-clear corneal incision at the 8:00 position.  A curvilinear capsulorrhexis was made with a cystotome and capsulorrhexis forceps.  Balanced salt solution was used to hydrodissect and hydrodelineate the nucleus.   Phacoemulsification was then used in stop and chop fashion to remove the lens nucleus and epinucleus.  The remaining cortex was then removed using the irrigation and  aspiration handpiece. Viscoelastic was then placed into the capsular bag to distend it for lens placement.  A lens was then injected into the capsular bag.  The remaining viscoelastic was aspirated.   Wounds were hydrated with balanced salt solution.  The anterior chamber was inflated to a physiologic pressure with balanced salt solution.   Intracameral vigamox 0.1 mL undiluted was injected into the eye and a drop placed onto the ocular surface.  No wound leaks were noted.  The patient was taken to the recovery room in stable condition without complications of anesthesia or surgery  Benay Pillow 10/28/2021, 12:06 PM

## 2021-10-28 NOTE — Transfer of Care (Signed)
Immediate Anesthesia Transfer of Care Note  Patient: Rachel Burton  Procedure(s) Performed: CATARACT EXTRACTION PHACO AND INTRAOCULAR LENS PLACEMENT (IOC) RIGHT (Right: Eye)  Patient Location: PACU  Anesthesia Type: MAC  Level of Consciousness: awake, alert  and patient cooperative  Airway and Oxygen Therapy: Patient Spontanous Breathing and Patient connected to supplemental oxygen  Post-op Assessment: Post-op Vital signs reviewed, Patient's Cardiovascular Status Stable, Respiratory Function Stable, Patent Airway and No signs of Nausea or vomiting  Post-op Vital Signs: Reviewed and stable  Complications: No notable events documented.

## 2021-10-28 NOTE — Telephone Encounter (Signed)
Requested Prescriptions  Pending Prescriptions Disp Refills  . buPROPion (WELLBUTRIN) 100 MG tablet [Pharmacy Med Name: buPROPion HCl 100 MG Oral Tablet] 180 tablet 0    Sig: Take 1 tablet by mouth twice daily     Psychiatry: Antidepressants - bupropion Passed - 10/26/2021  9:17 AM      Passed - Cr in normal range and within 360 days    Creatinine, Ser  Date Value Ref Range Status  02/05/2021 0.89 0.57 - 1.00 mg/dL Final         Passed - AST in normal range and within 360 days    AST  Date Value Ref Range Status  02/05/2021 24 0 - 40 IU/L Final         Passed - ALT in normal range and within 360 days    ALT  Date Value Ref Range Status  02/05/2021 19 0 - 32 IU/L Final         Passed - Completed PHQ-2 or PHQ-9 in the last 360 days      Passed - Last BP in normal range    BP Readings from Last 1 Encounters:  10/28/21 (!) 158/91         Passed - Valid encounter within last 6 months    Recent Outpatient Visits          2 months ago Recurrent major depressive disorder, in partial remission ([place])   [facility] [place], Deanna C, MD   8 months ago Annual physical exam   [facility] [place], [place], MD   1 year ago Recurrent major depressive disorder, in partial remission ([place])   [facility] [place], Deanna C, MD   1 year ago Annual physical exam   [facility] at [place], Deanna C, MD   2 years ago Primary insomnia    [facility] at [place], [place], MD      Future Appointments            In 3 months Juline Patch, MD [facility] at [facility], [place]   In 3 months Juline Patch, MD [facility] Primary Care and [facility] Medicine at [facility], [facility]

## 2021-10-28 NOTE — Anesthesia Postprocedure Evaluation (Signed)
Anesthesia Post Note  Patient: Rachel Burton  Procedure(s) Performed: CATARACT EXTRACTION PHACO AND INTRAOCULAR LENS PLACEMENT (IOC) RIGHT (Right: Eye)  Patient location during evaluation: PACU Anesthesia Type: MAC Level of consciousness: awake and alert Pain management: pain level controlled Vital Signs Assessment: post-procedure vital signs reviewed and stable Respiratory status: spontaneous breathing, nonlabored ventilation and respiratory function stable Cardiovascular status: blood pressure returned to baseline and stable Postop Assessment: no apparent nausea or vomiting Anesthetic complications: no   No notable events documented.   Last Vitals:  Vitals:   10/28/21 1207 10/28/21 1213  BP: (!) 153/86 (!) 158/91  Pulse: 73 70  Resp: 14 14  Temp: (!) 36.2 C (!) 36.2 C  SpO2: 96% 97%    Last Pain:  Vitals:   10/28/21 1213  TempSrc:   PainSc: 0-No pain                 Iran Ouch

## 2021-10-29 ENCOUNTER — Other Ambulatory Visit: Payer: Self-pay | Admitting: Family Medicine

## 2021-10-29 ENCOUNTER — Encounter: Payer: Self-pay | Admitting: Ophthalmology

## 2021-10-29 DIAGNOSIS — F5101 Primary insomnia: Secondary | ICD-10-CM

## 2021-11-05 ENCOUNTER — Encounter: Payer: Self-pay | Admitting: Ophthalmology

## 2021-11-05 DIAGNOSIS — H2512 Age-related nuclear cataract, left eye: Secondary | ICD-10-CM | POA: Diagnosis not present

## 2021-11-07 NOTE — Discharge Instructions (Signed)

## 2021-11-11 ENCOUNTER — Ambulatory Visit
Admission: RE | Admit: 2021-11-11 | Discharge: 2021-11-11 | Disposition: A | Discharge status: Home / Self Care | Payer: Medicare Other | Attending: Ophthalmology | Admitting: Ophthalmology

## 2021-11-11 ENCOUNTER — Encounter
Admission: RE | Disposition: A | Discharge status: Home / Self Care | Payer: Self-pay | Source: Home / Self Care | Attending: Ophthalmology

## 2021-11-11 ENCOUNTER — Encounter: Payer: Self-pay | Admitting: Ophthalmology

## 2021-11-11 ENCOUNTER — Ambulatory Visit: Payer: Medicare Other | Admitting: Anesthesiology

## 2021-11-11 ENCOUNTER — Other Ambulatory Visit: Payer: Self-pay

## 2021-11-11 DIAGNOSIS — Z6835 Body mass index (BMI) 35.0-35.9, adult: Secondary | ICD-10-CM | POA: Diagnosis not present

## 2021-11-11 DIAGNOSIS — H2511 Age-related nuclear cataract, right eye: Secondary | ICD-10-CM | POA: Diagnosis not present

## 2021-11-11 DIAGNOSIS — H2512 Age-related nuclear cataract, left eye: Secondary | ICD-10-CM | POA: Insufficient documentation

## 2021-11-11 DIAGNOSIS — F32A Depression, unspecified: Secondary | ICD-10-CM | POA: Diagnosis not present

## 2021-11-11 DIAGNOSIS — E669 Obesity, unspecified: Secondary | ICD-10-CM | POA: Insufficient documentation

## 2021-11-11 HISTORY — PX: CATARACT EXTRACTION W/PHACO: SHX586

## 2021-11-11 HISTORY — DX: Dyspnea, unspecified: R06.00

## 2021-11-11 SURGERY — PHACOEMULSIFICATION, CATARACT, WITH IOL INSERTION
Anesthesia: Monitor Anesthesia Care | Site: Eye | Laterality: Left

## 2021-11-11 MED ORDER — MOXIFLOXACIN HCL 0.5 % OP SOLN
OPHTHALMIC | Status: DC | PRN
  Administered 2021-11-11: .2 mL via OPHTHALMIC

## 2021-11-11 MED ORDER — SIGHTPATH DOSE#1 NA HYALUR & NA CHOND-NA HYALUR IO KIT
PACK | INTRAOCULAR | Status: DC | PRN
  Administered 2021-11-11: 1 via OPHTHALMIC

## 2021-11-11 MED ORDER — MIDAZOLAM HCL 2 MG/2ML IJ SOLN
INTRAMUSCULAR | Status: DC | PRN
  Administered 2021-11-11: 1 mg via INTRAVENOUS

## 2021-11-11 MED ORDER — ARMC OPHTHALMIC DILATING DROPS
1.0000 | OPHTHALMIC | Status: DC | PRN
  Administered 2021-11-11 (×3): 1 via OPHTHALMIC

## 2021-11-11 MED ORDER — LACTATED RINGERS IV SOLN: INTRAVENOUS | Status: DC

## 2021-11-11 MED ORDER — FENTANYL CITRATE (PF) 100 MCG/2ML IJ SOLN
INTRAMUSCULAR | Status: DC | PRN
  Administered 2021-11-11: 50 ug via INTRAVENOUS

## 2021-11-11 MED ORDER — TETRACAINE HCL 0.5 % OP SOLN
1.0000 [drp] | OPHTHALMIC | Status: DC | PRN
  Administered 2021-11-11 (×3): 1 [drp] via OPHTHALMIC

## 2021-11-11 MED ORDER — SIGHTPATH DOSE#1 BSS IO SOLN
INTRAOCULAR | Status: DC | PRN
  Administered 2021-11-11: 15 mL

## 2021-11-11 MED ORDER — SIGHTPATH DOSE#1 BSS IO SOLN
INTRAOCULAR | Status: DC | PRN
  Administered 2021-11-11: 75 mL via OPHTHALMIC

## 2021-11-11 MED ORDER — LIDOCAINE HCL (PF) 2 % IJ SOLN
INTRAOCULAR | Status: DC | PRN
  Administered 2021-11-11: 1 mL via INTRAOCULAR

## 2021-11-11 SURGICAL SUPPLY — 13 items
CATARACT SUITE SIGHTPATH (MISCELLANEOUS) ×1 IMPLANT
DISSECTOR HYDRO NUCLEUS 50X22 (MISCELLANEOUS) ×1 IMPLANT
FEE CATARACT SUITE SIGHTPATH (MISCELLANEOUS) ×1 IMPLANT
GLOVE SURG GAMMEX PI TX LF 7.5 (GLOVE) ×1 IMPLANT
GLOVE SURG SYN 8.5  E (GLOVE) ×1
GLOVE SURG SYN 8.5 E (GLOVE) ×1 IMPLANT
GLOVE SURG SYN 8.5 PF PI (GLOVE) ×1 IMPLANT
LENS IOL TECNIS EYHANCE 19.0 (Intraocular Lens) IMPLANT
NDL FILTER BLUNT 18X1 1/2 (NEEDLE) ×1 IMPLANT
NEEDLE FILTER BLUNT 18X1 1/2 (NEEDLE) ×1 IMPLANT
SYR 3ML LL SCALE MARK (SYRINGE) ×1 IMPLANT
SYR 5ML LL (SYRINGE) ×1 IMPLANT
WATER STERILE IRR 250ML POUR (IV SOLUTION) ×1 IMPLANT

## 2021-11-11 NOTE — H&P (Signed)
[facility]   Primary Care Physician:  Juline Patch, MD Ophthalmologist: Dr. Benay Pillow  Pre-Procedure History & Physical: HPI:  Rachel Burton is a 66 y.o. female here for cataract surgery.   Past Medical History:  Diagnosis Date   Allergy    Depression    Dyspnea    Insomnia    Motion sickness    car - back seat    Past Surgical History:  Procedure Laterality Date   CATARACT EXTRACTION W/PHACO Right 10/28/2021   Procedure: CATARACT EXTRACTION PHACO AND INTRAOCULAR LENS PLACEMENT (IOC) RIGHT;  Surgeon: Eulogio Bear, MD;  Location: [place];  Service: Ophthalmology;  Laterality: Right;  9.63 0:48.5   COLONOSCOPY  2010   cleared for 10 years    Prior to Admission medications   Medication Sig Start Date End Date Taking? Authorizing Provider  buPROPion (WELLBUTRIN) 100 MG tablet Take 1 tablet by mouth twice daily 10/28/21  Yes Juline Patch, MD  ibuprofen (ADVIL) 400 MG tablet Take 400 mg by mouth every 6 (six) hours as needed for moderate pain.   Yes [provider]  Multiple Vitamin (MULTIVITAMIN) tablet Take 1 tablet by mouth daily.   Yes [provider]  traZODone (DESYREL) 100 MG tablet TAKE 1 TABLET BY MOUTH AT BEDTIME 10/30/21  Yes Juline Patch, MD    Allergies as of 09/25/2021 - Review Complete 08/05/2021  Allergen Reaction Noted   Levofloxacin Other (See Comments) 09/28/2015   Zolpidem Other (See Comments) 09/28/2015    Family History  Problem Relation Age of Onset   Breast cancer Mother 62   Cancer Mother    Heart disease Maternal Grandmother    Hypertension Maternal Grandmother    Heart disease Maternal Grandfather     Social History   Socioeconomic History   Marital status: Widowed    Spouse name: Not on file   Number of children: 0   Years of education: Not on file   Highest education level: Not on file  Occupational History   Not on file  Tobacco Use   Smoking status: Never   Smokeless  tobacco: Never  Vaping Use   Vaping Use: Never used  Substance and Sexual Activity   Alcohol use: No   Drug use: No   Sexual activity: Not Currently    Birth control/protection: Post-menopausal  Other Topics Concern   Not on file  Social History Narrative   Pt lives alone   Social Determinants of Health   Financial Resource Strain: Low Risk  (10/02/2021)   Overall Financial Resource Strain (CARDIA)    Difficulty of Paying Living Expenses: Not hard at all  Food Insecurity: No Food Insecurity (10/02/2021)   Hunger Vital Sign    Worried About Running Out of Food in the Last Year: Never true    Ran Out of Food in the Last Year: Never true  Transportation Needs: No Transportation Needs (10/01/2020)   PRAPARE - Hydrologist (Medical): No    Lack of Transportation (Non-Medical): No  Physical Activity: Sufficiently Active (10/02/2021)   Exercise Vital Sign    Days of Exercise per Week: 7 days    Minutes of Exercise per Session: 60 min  Stress: No Stress Concern Present (10/02/2021)   [place]    Feeling of Stress : Not at all  Social Connections: Moderately Isolated (10/02/2021)   Social Connection and Isolation Panel [NHANES]  Frequency of Communication with Friends and Family: More than three times a week    Frequency of Social Gatherings with Friends and Family: More than three times a week    Attends Religious Services: Never    Marine scientist or Organizations: Yes    Attends Music therapist: More than 4 times per year    Marital Status: Widowed  Intimate Partner Violence: Not At Risk (10/02/2021)   Humiliation, Afraid, Rape, and Kick questionnaire    Fear of Current or Ex-Partner: No    Emotionally Abused: No    Physically Abused: No    Sexually Abused: No    Review of Systems: See HPI, otherwise negative ROS  Physical Exam: BP (!) 154/86   Pulse 74    Resp 16   Ht 5\' 6"  (1.676 m)   Wt 102.5 kg   SpO2 96%   BMI 36.48 kg/m  General:   Alert, cooperative in NAD Head:  Normocephalic and atraumatic. Respiratory:  Normal work of breathing. Cardiovascular:  RRR  Impression/Plan: Rachel Burton is here for cataract surgery.  Risks, benefits, limitations, and alternatives regarding cataract surgery have been reviewed with the patient.  Questions have been answered.  All parties agreeable.   Benay Pillow, MD  11/11/2021, 8:23 AM

## 2021-11-11 NOTE — Anesthesia Postprocedure Evaluation (Signed)
Anesthesia Post Note  Patient: Kenia Teagarden  Procedure(s) Performed: CATARACT EXTRACTION PHACO AND INTRAOCULAR LENS PLACEMENT (IOC) LEFT 9.54 00:52.3 (Left: Eye)  Patient location during evaluation: PACU Anesthesia Type: MAC Level of consciousness: awake and alert Pain management: pain level controlled Vital Signs Assessment: post-procedure vital signs reviewed and stable Respiratory status: spontaneous breathing, nonlabored ventilation, respiratory function stable and patient connected to nasal cannula oxygen Cardiovascular status: blood pressure returned to baseline and stable Postop Assessment: no apparent nausea or vomiting Anesthetic complications: no  No notable events documented.   Last Vitals:  Vitals:   11/11/21 0845 11/11/21 0853  BP: 132/84 (!) 152/84  Pulse: 71 71  Resp: 14 11  Temp: (!) 36.2 C (!) 36.2 C  SpO2: 94% 97%    Last Pain:  Vitals:   11/11/21 0853  PainSc: 0-No pain                 Dimas Millin

## 2021-11-11 NOTE — Transfer of Care (Signed)
Immediate Anesthesia Transfer of Care Note  Patient: Rachel Burton  Procedure(s) Performed: CATARACT EXTRACTION PHACO AND INTRAOCULAR LENS PLACEMENT (IOC) LEFT 9.54 00:52.3 (Left: Eye)  Patient Location: PACU  Anesthesia Type: MAC  Level of Consciousness: awake, alert  and patient cooperative  Airway and Oxygen Therapy: Patient Spontanous Breathing and Patient connected to supplemental oxygen  Post-op Assessment: Post-op Vital signs reviewed, Patient's Cardiovascular Status Stable, Respiratory Function Stable, Patent Airway and No signs of Nausea or vomiting  Post-op Vital Signs: Reviewed and stable  Complications: No notable events documented.

## 2021-11-11 NOTE — Op Note (Signed)
OPERATIVE NOTE  Niah Heinle [id number] 11/11/2021   PREOPERATIVE DIAGNOSIS:  Nuclear sclerotic cataract left eye.  H25.12   POSTOPERATIVE DIAGNOSIS:    Nuclear sclerotic cataract left eye.     PROCEDURE:  Phacoemusification with posterior chamber intraocular lens placement of the left eye   LENS:   Implant Name Type Inv. Item Serial No. Manufacturer Lot No. LRB No. Used Action  LENS IOL TECNIS EYHANCE 19.0 - [id number] Intraocular Lens LENS IOL TECNIS EYHANCE 19.0 [id number] [employer]  Left 1 Implanted      Procedure(s): CATARACT EXTRACTION PHACO AND INTRAOCULAR LENS PLACEMENT (IOC) LEFT 9.54 00:52.3 (Left)  DIB00 +19.0   SURGEON:  Benay Pillow, MD, MPH   ANESTHESIA:  Topical with tetracaine drops augmented with 1% preservative-free intracameral lidocaine.  ESTIMATED BLOOD LOSS: <1 mL   COMPLICATIONS:  None.   DESCRIPTION OF PROCEDURE:  The patient was identified in the holding room and transported to the operating room and placed in the supine position under the operating microscope.  The left eye was identified as the operative eye and it was prepped and draped in the usual sterile ophthalmic fashion.   A 1.0 millimeter clear-corneal paracentesis was made at the 5:00 position. 0.5 ml of preservative-free 1% lidocaine with epinephrine was injected into the anterior chamber.  The anterior chamber was filled with viscoelastic.  A 2.4 millimeter keratome was used to make a near-clear corneal incision at the 2:00 position.  A curvilinear capsulorrhexis was made with a cystotome and capsulorrhexis forceps.  Balanced salt solution was used to hydrodissect and hydrodelineate the nucleus.   Phacoemulsification was then used in stop and chop fashion to remove the lens nucleus and epinucleus.  The remaining cortex was then removed using the irrigation and aspiration handpiece. Viscoelastic was then placed into the capsular bag to distend it for lens placement.  A lens was then injected  into the capsular bag.  The remaining viscoelastic was aspirated.   Wounds were hydrated with balanced salt solution.  The anterior chamber was inflated to a physiologic pressure with balanced salt solution.  Intracameral vigamox 0.1 mL undiltued was injected into the eye and a drop placed onto the ocular surface.  No wound leaks were noted.  The patient was taken to the recovery room in stable condition without complications of anesthesia or surgery  Benay Pillow 11/11/2021, 8:47 AM

## 2021-11-11 NOTE — Anesthesia Preprocedure Evaluation (Signed)
Anesthesia Evaluation  Patient identified by MRN, date of birth, ID band Patient awake    Reviewed: Allergy & Precautions, NPO status , Patient's Chart, lab work & pertinent test results  Airway Mallampati: III  TM Distance: >3 FB Neck ROM: full    Dental no notable dental hx.    Pulmonary neg pulmonary ROS   Pulmonary exam normal        Cardiovascular negative cardio ROS Normal cardiovascular exam     Neuro/Psych  PSYCHIATRIC DISORDERS  Depression    negative neurological ROS     GI/Hepatic negative GI ROS, Neg liver ROS,,,  Endo/Other  negative endocrine ROS    Renal/GU      Musculoskeletal   Abdominal  (+) + obese  Peds  Hematology negative hematology ROS (+)   Anesthesia Other Findings Elevated blood pressure today. No signs of end-organ damage. Denies CP, SOB, Vision changes  Past Medical History: No date: Allergy No date: Depression No date: Insomnia No date: Motion sickness     Comment:  car - back seat  Past Surgical History: 2010: COLONOSCOPY     Comment:  cleared for 10 years  BMI    Body Mass Index: 35.35 kg/m      Reproductive/Obstetrics negative OB ROS                              Anesthesia Physical Anesthesia Plan  ASA: 2  Anesthesia Plan: MAC   Post-op Pain Management:    Induction: Intravenous  PONV Risk Score and Plan:   Airway Management Planned: Natural Airway  Additional Equipment:   Intra-op Plan:   Post-operative Plan:   Informed Consent: I have reviewed the patients History and Physical, chart, labs and discussed the procedure including the risks, benefits and alternatives for the proposed anesthesia with the patient or authorized representative who has indicated his/her understanding and acceptance.     Dental advisory given  Plan Discussed with: Anesthesiologist, CRNA and Surgeon  Anesthesia Plan Comments:           Anesthesia Quick Evaluation

## 2021-11-12 ENCOUNTER — Encounter: Payer: Self-pay | Admitting: Ophthalmology

## 2021-11-27 DIAGNOSIS — M19011 Primary osteoarthritis, right shoulder: Secondary | ICD-10-CM | POA: Diagnosis not present

## 2021-11-27 DIAGNOSIS — M19012 Primary osteoarthritis, left shoulder: Secondary | ICD-10-CM | POA: Diagnosis not present

## 2021-11-27 DIAGNOSIS — M25512 Pain in left shoulder: Secondary | ICD-10-CM | POA: Diagnosis not present

## 2021-11-27 DIAGNOSIS — M25511 Pain in right shoulder: Secondary | ICD-10-CM | POA: Diagnosis not present

## 2021-12-02 ENCOUNTER — Other Ambulatory Visit: Payer: Self-pay | Admitting: Orthopedic Surgery

## 2021-12-02 DIAGNOSIS — M19012 Primary osteoarthritis, left shoulder: Secondary | ICD-10-CM

## 2021-12-05 ENCOUNTER — Ambulatory Visit
Admission: RE | Admit: 2021-12-05 | Discharge: 2021-12-05 | Disposition: A | Discharge status: Home / Self Care | Payer: Medicare Other | Source: Ambulatory Visit | Attending: Orthopedic Surgery | Admitting: Orthopedic Surgery

## 2021-12-05 DIAGNOSIS — M24012 Loose body in left shoulder: Secondary | ICD-10-CM | POA: Diagnosis not present

## 2021-12-05 DIAGNOSIS — M25412 Effusion, left shoulder: Secondary | ICD-10-CM | POA: Diagnosis not present

## 2021-12-05 DIAGNOSIS — M19012 Primary osteoarthritis, left shoulder: Secondary | ICD-10-CM | POA: Insufficient documentation

## 2021-12-06 ENCOUNTER — Ambulatory Visit: Payer: Medicare Other

## 2022-01-23 ENCOUNTER — Other Ambulatory Visit: Payer: Self-pay | Admitting: Orthopedic Surgery

## 2022-01-23 DIAGNOSIS — M19012 Primary osteoarthritis, left shoulder: Secondary | ICD-10-CM | POA: Diagnosis not present

## 2022-01-24 ENCOUNTER — Other Ambulatory Visit: Payer: Self-pay | Admitting: Orthopedic Surgery

## 2022-01-24 ENCOUNTER — Other Ambulatory Visit: Payer: Self-pay | Admitting: Family Medicine

## 2022-01-24 DIAGNOSIS — F5101 Primary insomnia: Secondary | ICD-10-CM

## 2022-01-24 DIAGNOSIS — F3341 Major depressive disorder, recurrent, in partial remission: Secondary | ICD-10-CM

## 2022-02-05 ENCOUNTER — Telehealth: Payer: Self-pay

## 2022-02-05 ENCOUNTER — Ambulatory Visit (INDEPENDENT_AMBULATORY_CARE_PROVIDER_SITE_OTHER): Payer: Medicare Other | Admitting: Family Medicine

## 2022-02-05 ENCOUNTER — Encounter: Payer: Self-pay | Admitting: Family Medicine

## 2022-02-05 VITALS — BP 128/84 | HR 80 | Ht 66.0 in | Wt 233.0 lb

## 2022-02-05 DIAGNOSIS — E7801 Familial hypercholesterolemia: Secondary | ICD-10-CM | POA: Diagnosis not present

## 2022-02-05 DIAGNOSIS — F5101 Primary insomnia: Secondary | ICD-10-CM | POA: Diagnosis not present

## 2022-02-05 DIAGNOSIS — E78019 Familial hypercholesterolemia, unspecified: Secondary | ICD-10-CM

## 2022-02-05 DIAGNOSIS — Z1231 Encounter for screening mammogram for malignant neoplasm of breast: Secondary | ICD-10-CM | POA: Diagnosis not present

## 2022-02-05 DIAGNOSIS — F3341 Major depressive disorder, recurrent, in partial remission: Secondary | ICD-10-CM

## 2022-02-05 MED ORDER — TRAZODONE HCL 100 MG PO TABS: 100.0000 mg | ORAL_TABLET | Freq: Every day | ORAL | 1 refills | Status: DC

## 2022-02-05 MED ORDER — BUPROPION HCL 100 MG PO TABS: 100.0000 mg | ORAL_TABLET | Freq: Two times a day (BID) | ORAL | 1 refills | Status: DC

## 2022-02-05 NOTE — Progress Notes (Signed)
Date:  02/05/2022   Name:  Rachel Burton   DOB:  1955/06/02   MRN:  [id number]   Chief Complaint: Anxiety and Insomnia  Anxiety Presents for follow-up visit. Symptoms include insomnia. Patient reports no chest pain, compulsions, confusion, decreased concentration, depressed mood, dizziness, dry mouth, excessive worry, feeling of choking, hyperventilation, impotence, irritability, malaise, muscle tension, nausea, nervous/anxious behavior, obsessions, palpitations, panic, restlessness, shortness of breath or suicidal ideas. Symptoms occur occasionally.    Insomnia Primary symptoms: no fragmented sleep, no sleep disturbance, no difficulty falling asleep, no somnolence, no frequent awakening, no premature morning awakening, no malaise/fatigue, no napping.   The problem has been gradually improving since onset.    Lab Results  Component Value Date   NA 144 02/05/2021   K 4.7 02/05/2021   CO2 24 02/05/2021   GLUCOSE 88 02/05/2021   BUN 14 02/05/2021   CREATININE 0.89 02/05/2021   CALCIUM 9.6 02/05/2021   EGFR 71 02/05/2021   GFRNONAA 83 02/02/2020   Lab Results  Component Value Date   CHOL 197 02/05/2021   HDL 82 02/05/2021   LDLCALC 104 (H) 02/05/2021   TRIG 60 02/05/2021   CHOLHDL 2.7 01/28/2018   No results found for: "TSH" No results found for: "HGBA1C" No results found for: "WBC", "HGB", "HCT", "MCV", "PLT" Lab Results  Component Value Date   ALT 19 02/05/2021   AST 24 02/05/2021   ALKPHOS 121 02/05/2021   BILITOT 0.3 02/05/2021   No results found for: "25OHVITD2", "25OHVITD3", "VD25OH"   Review of Systems  Constitutional:  Negative for chills, fever, irritability and malaise/fatigue.  HENT:  Negative for drooling, ear discharge, ear pain and sore throat.   Respiratory:  Negative for cough, shortness of breath and wheezing.   Cardiovascular:  Negative for chest pain, palpitations and leg swelling.  Gastrointestinal:  Negative for abdominal pain, blood in stool,  constipation, diarrhea and nausea.  Endocrine: Negative for polydipsia.  Genitourinary:  Negative for dysuria, frequency, hematuria, impotence and urgency.  Musculoskeletal:  Negative for back pain, myalgias and neck pain.  Skin:  Negative for rash.  Allergic/Immunologic: Negative for environmental allergies.  Neurological:  Negative for dizziness and headaches.  Hematological:  Does not bruise/bleed easily.  Psychiatric/Behavioral:  Negative for confusion, decreased concentration, sleep disturbance and suicidal ideas. The patient has insomnia. The patient is not nervous/anxious.     Patient Active Problem List   Diagnosis Date Noted   Post-menopausal 02/02/2019   Insomnia 09/28/2015   Depression 09/28/2015    Allergies  Allergen Reactions   Levofloxacin Other (See Comments)    Dizziness   Zolpidem Other (See Comments)    Sleep walking    Past Surgical History:  Procedure Laterality Date   CATARACT EXTRACTION W/PHACO Right 10/28/2021   Procedure: CATARACT EXTRACTION PHACO AND INTRAOCULAR LENS PLACEMENT (IOC) RIGHT;  Surgeon: Eulogio Bear, MD;  Location: [place];  Service: Ophthalmology;  Laterality: Right;  9.63 0:48.5   CATARACT EXTRACTION W/PHACO Left 11/11/2021   Procedure: CATARACT EXTRACTION PHACO AND INTRAOCULAR LENS PLACEMENT (IOC) LEFT 9.54 00:52.3;  Surgeon: Eulogio Bear, MD;  Location: [place];  Service: Ophthalmology;  Laterality: Left;   COLONOSCOPY  2010   cleared for 10 years    Social History   Tobacco Use   Smoking status: Never   Smokeless tobacco: Never  Vaping Use   Vaping Use: Never used  Substance Use Topics   Alcohol use: No   Drug use: No  Medication list has been reviewed and updated.  Current Meds  Medication Sig   buPROPion (WELLBUTRIN) 100 MG tablet Take 1 tablet by mouth twice daily   ibuprofen (ADVIL) 400 MG tablet Take 400 mg by mouth every 6 (six) hours as needed for moderate pain.   Multiple  Vitamin (MULTIVITAMIN) tablet Take 1 tablet by mouth daily.   traZODone (DESYREL) 100 MG tablet TAKE 1 TABLET BY MOUTH AT BEDTIME       02/05/2022   10:29 AM 08/05/2021   10:10 AM 02/05/2021    8:45 AM 07/18/2020    8:34 AM  GAD 7 : Generalized Anxiety Score  Nervous, Anxious, on Edge 0 0 0 0  Control/stop worrying 0 0 0 0  Worry too much - different things 0 0 0 0  Trouble relaxing 0 0 0 0  Restless 0 0 0 0  Easily annoyed or irritable 0 0 0 0  Afraid - awful might happen 0 0 0 0  Total GAD 7 Score 0 0 0 0  Anxiety Difficulty Not difficult at all Not difficult at all Not difficult at all        02/05/2022   10:29 AM 10/02/2021    1:25 PM 08/05/2021   10:09 AM  Depression screen PHQ 2/9  Decreased Interest 0 0 1  Down, Depressed, Hopeless 0 0 1  PHQ - 2 Score 0 0 2  Altered sleeping 0 3 2  Tired, decreased energy 0 3 1  Change in appetite 0 0 0  Feeling bad or failure about yourself  0 0 0  Trouble concentrating 0 0 0  Moving slowly or fidgety/restless 0 0 0  Suicidal thoughts 0 0 0  PHQ-9 Score 0 6 5  Difficult doing work/chores Not difficult at all Not difficult at all Not difficult at all    BP Readings from Last 3 Encounters:  02/05/22 128/84  11/11/21 (!) 152/84  10/28/21 (!) 158/91    Physical Exam Vitals and nursing note reviewed. Exam conducted with a chaperone present.  Constitutional:      General: She is not in acute distress.    Appearance: She is not diaphoretic.  HENT:     Head: Normocephalic and atraumatic.     Right Ear: External ear normal.     Left Ear: External ear normal.     Nose: Nose normal.  Eyes:     General:        Right eye: No discharge.        Left eye: No discharge.     Conjunctiva/sclera: Conjunctivae normal.     Pupils: Pupils are equal, round, and reactive to light.  Neck:     Thyroid: No thyromegaly.     Vascular: No JVD.  Cardiovascular:     Rate and Rhythm: Normal rate and regular rhythm.     Heart sounds: Normal heart  sounds. No murmur heard.    No friction rub. No gallop.  Pulmonary:     Effort: Pulmonary effort is normal.     Breath sounds: Normal breath sounds.  Chest:  Breasts:    Right: Normal. No swelling, bleeding, inverted nipple, mass, nipple discharge, skin change or tenderness.     Left: Normal. No swelling, bleeding, inverted nipple, mass, nipple discharge, skin change or tenderness.  Abdominal:     General: Bowel sounds are normal.     Palpations: Abdomen is soft. There is no mass.     Tenderness: There is no abdominal tenderness.  There is no guarding.  Musculoskeletal:        General: Normal range of motion.     Cervical back: Normal range of motion and neck supple.  Lymphadenopathy:     Cervical: No cervical adenopathy.  Skin:    General: Skin is warm and dry.  Neurological:     Mental Status: She is alert.     Deep Tendon Reflexes: Reflexes are normal and symmetric.     Wt Readings from Last 3 Encounters:  02/05/22 233 lb (105.7 kg)  11/11/21 226 lb (102.5 kg)  10/28/21 224 lb (101.6 kg)    BP 128/84   Pulse 80   Ht 5\' 6"  (1.676 m)   Wt 233 lb (105.7 kg)   SpO2 96%   BMI 37.61 kg/m   Assessment and Plan: 1. Recurrent major depressive disorder, in partial remission (HCC) Chronic.  Controlled.  Stable.  PHQ is 0 GAD score is 0 continue hopefully on 100 mg twice a day. - buPROPion (WELLBUTRIN) 100 MG tablet; Take 1 tablet (100 mg total) by mouth 2 (two) times daily.  Dispense: 180 tablet; Refill: 1  2. Primary insomnia .  Controlled.  Stable.  Insomnia is improved on trazodone 100 mg nightly. - traZODone (DESYREL) 100 MG tablet; Take 1 tablet (100 mg total) by mouth at bedtime.  Dispense: 90 tablet; Refill: 1  3. Breast cancer screening by mammogram Exam done today with no palpable mass.  Will refer for mammography. - MM 3D SCREEN BREAST BILATERAL  4. Familial hypercholesterolemia Chronic.  Diet controlled.  Stable.  Will check lipid panel with CMP for  transaminases. - Lipid Panel With LDL/HDL Ratio - Comprehensive Metabolic Panel (CMET)     Otilio Miu, MD

## 2022-02-05 NOTE — Telephone Encounter (Signed)
Called pt with mammo in [place] on 04/23/2022 @ 10:00

## 2022-02-06 ENCOUNTER — Encounter: Payer: Medicare Other | Admitting: Family Medicine

## 2022-02-07 ENCOUNTER — Encounter
Admission: RE | Admit: 2022-02-07 | Discharge: 2022-02-07 | Disposition: A | Discharge status: Home / Self Care | Payer: Medicare Other | Source: Ambulatory Visit | Attending: Orthopedic Surgery | Admitting: Orthopedic Surgery

## 2022-02-07 ENCOUNTER — Other Ambulatory Visit: Payer: Self-pay

## 2022-02-07 VITALS — BP 171/89 | HR 77 | Resp 16 | Ht 66.0 in | Wt 232.0 lb

## 2022-02-07 DIAGNOSIS — Z01818 Encounter for other preprocedural examination: Secondary | ICD-10-CM | POA: Diagnosis not present

## 2022-02-07 LAB — CBC WITH DIFFERENTIAL/PLATELET
Abs Immature Granulocytes: 0.03 10*3/uL (ref 0.00–0.07)
Basophils Absolute: 0.1 10*3/uL (ref 0.0–0.1)
Basophils Relative: 1 %
Eosinophils Absolute: 0.1 10*3/uL (ref 0.0–0.5)
Eosinophils Relative: 2 %
HCT: 36.7 % (ref 36.0–46.0)
Hemoglobin: 12.2 g/dL (ref 12.0–15.0)
Immature Granulocytes: 1 %
Lymphocytes Relative: 30 %
Lymphs Abs: 1.6 10*3/uL (ref 0.7–4.0)
MCH: 31.3 pg (ref 26.0–34.0)
MCHC: 33.2 g/dL (ref 30.0–36.0)
MCV: 94.1 fL (ref 80.0–100.0)
Monocytes Absolute: 0.6 10*3/uL (ref 0.1–1.0)
Monocytes Relative: 11 %
Neutro Abs: 2.9 10*3/uL (ref 1.7–7.7)
Neutrophils Relative %: 55 %
Platelets: 208 10*3/uL (ref 150–400)
RBC: 3.9 MIL/uL (ref 3.87–5.11)
RDW: 12.2 % (ref 11.5–15.5)
WBC: 5.2 10*3/uL (ref 4.0–10.5)
nRBC: 0 % (ref 0.0–0.2)

## 2022-02-07 LAB — COMPREHENSIVE METABOLIC PANEL
ALT: 15 IU/L (ref 0–32)
AST: 20 IU/L (ref 0–40)
Albumin/Globulin Ratio: 2 (ref 1.2–2.2)
Albumin: 4.1 g/dL (ref 3.9–4.9)
Alkaline Phosphatase: 123 IU/L — ABNORMAL HIGH (ref 44–121)
BUN/Creatinine Ratio: 19 (ref 12–28)
BUN: 16 mg/dL (ref 8–27)
Bilirubin Total: 0.3 mg/dL (ref 0.0–1.2)
CO2: 25 mmol/L (ref 20–29)
Calcium: 9.2 mg/dL (ref 8.7–10.3)
Chloride: 101 mmol/L (ref 96–106)
Creatinine, Ser: 0.85 mg/dL (ref 0.57–1.00)
Globulin, Total: 2.1 g/dL (ref 1.5–4.5)
Glucose: 91 mg/dL (ref 70–99)
Potassium: 4.4 mmol/L (ref 3.5–5.2)
Sodium: 139 mmol/L (ref 134–144)
Total Protein: 6.2 g/dL (ref 6.0–8.5)
eGFR: 75 mL/min/{1.73_m2} (ref >59)

## 2022-02-07 LAB — URINALYSIS, COMPLETE (UACMP) WITH MICROSCOPIC
Bacteria, UA: NONE SEEN
Bilirubin Urine: NEGATIVE
Glucose, UA: NEGATIVE mg/dL
Hgb urine dipstick: NEGATIVE
Ketones, ur: NEGATIVE mg/dL
Leukocytes,Ua: NEGATIVE
Nitrite: NEGATIVE
Protein, ur: NEGATIVE mg/dL
Specific Gravity, Urine: 1.011 (ref 1.005–1.030)
pH: 6 (ref 5.0–8.0)

## 2022-02-07 LAB — LIPID PANEL WITH LDL/HDL RATIO
Cholesterol, Total: 215 mg/dL — ABNORMAL HIGH (ref 100–199)
HDL: 94 mg/dL (ref >39)
LDL Chol Calc (NIH): 108 mg/dL — ABNORMAL HIGH (ref 0–99)
LDL/HDL Ratio: 1.1 ratio (ref 0.0–3.2)
Triglycerides: 75 mg/dL (ref 0–149)
VLDL Cholesterol Cal: 13 mg/dL (ref 5–40)

## 2022-02-07 LAB — SURGICAL PCR SCREEN
MRSA, PCR: NEGATIVE
Staphylococcus aureus: NEGATIVE

## 2022-02-07 LAB — TYPE AND SCREEN
ABO/RH(D): O POS
Antibody Screen: NEGATIVE

## 2022-02-07 NOTE — Patient Instructions (Signed)
Your procedure is scheduled on: 02/21/22 Report to [place]. To find out your arrival time please call [phone] between 1PM - 3PM on 02/20/22.  Remember: Instructions that are not followed completely may result in serious medical risk, up to and including death, or upon the discretion of your surgeon and anesthesiologist your surgery may need to be rescheduled.     _X__ 1. Do not eat food after midnight the night before your procedure.                 No gum chewing or hard candies. You may drink clear liquids up to 2 hours                 before you are scheduled to arrive for your surgery- DO not drink clear                 liquids within 2 hours of the start of your surgery.                 Clear Liquids include:  water, apple juice without pulp, clear carbohydrate                 drink such as Clearfast or Gatorade, Black Coffee or Tea (Do not add                 anything to coffee or tea). Diabetics water only  DRINK THE ENSURE 2 HOURS BEFORE ARRIVING FOR SURGERY  __X__2.  On the morning of surgery brush your teeth with toothpaste and water, you                 may rinse your mouth with mouthwash if you wish.  Do not swallow any              toothpaste of mouthwash.     _X__ 3.  No Alcohol for 24 hours before or after surgery.   _X__ 4.  Do Not Smoke or use e-cigarettes For 24 Hours Prior to Your Surgery.                 Do not use any chewable tobacco products for at least 6 hours prior to                 surgery.  ____  5.  Bring all medications with you on the day of surgery if instructed.   __X__  6.  Notify your doctor if there is any change in your medical condition      (cold, fever, infections).     Do not wear jewelry, make-up, hairpins, clips or nail polish. Do not wear lotions, powders, or perfumes. NO DEODORANT Do not shave body hair 48 hours prior to surgery. Men may shave face and neck. Do not bring  valuables to the hospital.    [facility] is not responsible for any belongings or valuables.  Contacts, dentures/partials or body piercings may not be worn into surgery. Bring a case for your contacts, glasses or hearing aids, a denture cup will be supplied. Leave your suitcase in the car. After surgery it may be brought to your room. For patients admitted to the hospital, discharge time is determined by your treatment team. In case of increased patient census, it may be necessary for you, the patient, to continue your postoperative care within the Same Day Surgery department.   Patients discharged the day of surgery will need  a licensed driver. You will not be allowed to drive yourself home.   You must have a responsible person in the home with you for the first 24 hours after surgery   Please read over the following fact sheets that you were given:   MRSA Information, CHG soap, Ensure, Incentive Spirometer  __X__ Take these medicines the morning of surgery with A SIP OF WATER:    1. buPROPion (WELLBUTRIN) 100 MG tablet  2.   3.   ____ Fleet Enema (as directed)   __X__ Use CHG Soap/SAGE wipes as directed  ____ Use inhalers on the day of surgery  ____ Stop metformin/Janumet/Farxiga 2 days prior to surgery    ____ Take 1/2 of usual insulin dose the night before surgery. No insulin the morning          of surgery.   ____ Stop Blood Thinners Coumadin/Plavix/Xarelto/Pleta/Pradaxa/Eliquis/Effient/Aspirin  on   Or contact your Surgeon, Cardiologist or Medical Doctor regarding  ability to stop your blood thinners  __X__ Stop Anti-inflammatories 7 days before surgery such as Advil, Ibuprofen, Motrin,  BC or Goodies Powder, Naprosyn, Naproxen, Aleve, Aspirin You may substitute Tylenol as needed. Do not exceed 3000 mg per day   __X__ Stop all herbals and supplements, fish oil or vitamins 7 days before surgery.   ____ Bring C-Pap to the hospital.     Preparing for Total Shoulder  Arthroplasty  Before surgery, you can play an important role by reducing the number of germs on your skin by using the following products:  Benzoyl Peroxide Gel  o Reduces the number of germs present on the skin  o Applied twice a day to shoulder area starting two days before surgery  Chlorhexidine Gluconate (CHG) Soap  o An antiseptic cleaner that kills germs and bonds with the skin to continue killing germs even after washing  o Used for showering the night before surgery and morning of surgery  BENZOYL PEROXIDE 5% GEL  Please do not use if you have an allergy to benzoyl peroxide. If your skin becomes reddened/irritated stop using the benzoyl peroxide.  Starting two days before surgery, apply as follows:  1. Apply benzoyl peroxide in the morning and at night. Apply after taking a shower. If you are not taking a shower clean entire shoulder front, back, and side along with the armpit with a clean wet washcloth.  2. Place a quarter-sized dollop on your shoulder and rub in thoroughly, making sure to cover the front, back, and side of your shoulder, along with the armpit.  2 days before ____ AM ____ PM 1 day before ____ AM ____ PM  3. Do this twice a day for two days. (Last application is the night before surgery, AFTER using the CHG soap).  4. Do NOT apply benzoyl peroxide gel on the day of surgery.     Preparing for Surgery with CHLORHEXIDINE GLUCONATE (CHG) Soap  Chlorhexidine Gluconate (CHG) Soap  o An antiseptic cleaner that kills germs and bonds with the skin to continue killing germs even after washing  o Used for showering the night before surgery and morning of surgery  Before surgery, you can play an important role by reducing the number of germs on your skin.  CHG (Chlorhexidine gluconate) soap is an antiseptic cleanser which kills germs and bonds with the skin to continue killing germs even after washing.  Please do not use if you have an allergy to CHG or  antibacterial soaps. If your skin becomes reddened/irritated stop  using the CHG.  1. Shower the NIGHT BEFORE SURGERY and the MORNING OF SURGERY with CHG soap.  2. If you choose to wash your hair, wash your hair first as usual with your normal shampoo.  3. After shampooing, rinse your hair and body thoroughly to remove the shampoo.  4. Use CHG as you would any other liquid soap. You can apply CHG directly to the skin and wash gently with a scrungie or a clean washcloth.  5. Apply the CHG soap to your body only from the neck down. Do not use on open wounds or open sores. Avoid contact with your eyes, ears, mouth, and genitals (private parts). Wash face and genitals (private parts) with your normal soap.  6. Wash thoroughly, paying special attention to the area where your surgery will be performed.  7. Thoroughly rinse your body with warm water.  8. Do not shower/wash with your normal soap after using and rinsing off the CHG soap.  9. Pat yourself dry with a clean towel.  10. Wear clean pajamas to bed the night before surgery.  12. Place clean sheets on your bed the night of your first shower and do not sleep with pets.  13. Shower again with the CHG soap on the day of surgery prior to arriving at the hospital.  14. Do not apply any deodorants/lotions/powders.  15. Please wear clean clothes to the hospital.

## 2022-02-20 MED ORDER — LACTATED RINGERS IV SOLN: INTRAVENOUS | Status: DC

## 2022-02-20 MED ORDER — CEFAZOLIN SODIUM-DEXTROSE 2-4 GM/100ML-% IV SOLN
2.0000 g | INTRAVENOUS | Status: AC
  Administered 2022-02-21: 2 g via INTRAVENOUS

## 2022-02-20 MED ORDER — ORAL CARE MOUTH RINSE: 15.0000 mL | Freq: Once | OROMUCOSAL | Status: AC

## 2022-02-20 MED ORDER — CHLORHEXIDINE GLUCONATE 0.12 % MT SOLN: 15.0000 mL | Freq: Once | OROMUCOSAL | Status: AC

## 2022-02-20 MED ORDER — FAMOTIDINE 20 MG PO TABS: 20.0000 mg | ORAL_TABLET | Freq: Once | ORAL | Status: AC

## 2022-02-21 ENCOUNTER — Ambulatory Visit: Payer: Medicare Other | Admitting: Certified Registered Nurse Anesthetist

## 2022-02-21 ENCOUNTER — Ambulatory Visit: Payer: Medicare Other

## 2022-02-21 ENCOUNTER — Encounter: Payer: Self-pay | Admitting: Orthopedic Surgery

## 2022-02-21 ENCOUNTER — Ambulatory Visit: Payer: Medicare Other | Admitting: Urgent Care

## 2022-02-21 ENCOUNTER — Ambulatory Visit
Admission: RE | Admit: 2022-02-21 | Discharge: 2022-02-21 | Disposition: A | Discharge status: Home / Self Care | Payer: Medicare Other | Attending: Orthopedic Surgery | Admitting: Orthopedic Surgery

## 2022-02-21 ENCOUNTER — Other Ambulatory Visit: Payer: Self-pay

## 2022-02-21 ENCOUNTER — Encounter
Admission: RE | Disposition: A | Discharge status: Home / Self Care | Payer: Self-pay | Source: Home / Self Care | Attending: Orthopedic Surgery

## 2022-02-21 DIAGNOSIS — M19012 Primary osteoarthritis, left shoulder: Secondary | ICD-10-CM | POA: Diagnosis not present

## 2022-02-21 DIAGNOSIS — G8918 Other acute postprocedural pain: Secondary | ICD-10-CM | POA: Diagnosis not present

## 2022-02-21 DIAGNOSIS — M7522 Bicipital tendinitis, left shoulder: Secondary | ICD-10-CM | POA: Diagnosis not present

## 2022-02-21 DIAGNOSIS — Z96612 Presence of left artificial shoulder joint: Secondary | ICD-10-CM | POA: Diagnosis not present

## 2022-02-21 DIAGNOSIS — Z471 Aftercare following joint replacement surgery: Secondary | ICD-10-CM | POA: Diagnosis not present

## 2022-02-21 HISTORY — PX: REVERSE SHOULDER ARTHROPLASTY: SHX5054

## 2022-02-21 SURGERY — ARTHROPLASTY, SHOULDER, TOTAL, REVERSE
Anesthesia: General | Site: Shoulder | Laterality: Left

## 2022-02-21 MED ORDER — HYDROMORPHONE HCL 1 MG/ML IJ SOLN
INTRAMUSCULAR | Status: DC | PRN
  Administered 2022-02-21 (×2): .5 mg via INTRAVENOUS

## 2022-02-21 MED ORDER — ASPIRIN 325 MG PO TBEC: 325.0000 mg | DELAYED_RELEASE_TABLET | Freq: Every day | ORAL | 0 refills | Status: AC

## 2022-02-21 MED ORDER — ACETAMINOPHEN 10 MG/ML IV SOLN
INTRAVENOUS | Status: AC
  Filled 2022-02-21: qty 100

## 2022-02-21 MED ORDER — 0.9 % SODIUM CHLORIDE (POUR BTL) OPTIME
TOPICAL | Status: DC | PRN
  Administered 2022-02-21: 500 mL

## 2022-02-21 MED ORDER — ACETAMINOPHEN 10 MG/ML IV SOLN
INTRAVENOUS | Status: DC | PRN
  Administered 2022-02-21: 1000 mg via INTRAVENOUS

## 2022-02-21 MED ORDER — EPINEPHRINE PF 1 MG/ML IJ SOLN
INTRAMUSCULAR | Status: AC
  Filled 2022-02-21: qty 1

## 2022-02-21 MED ORDER — BUPIVACAINE LIPOSOME 1.3 % IJ SUSP
INTRAMUSCULAR | Status: AC
  Filled 2022-02-21: qty 20

## 2022-02-21 MED ORDER — PHENYLEPHRINE HCL (PRESSORS) 10 MG/ML IV SOLN
INTRAVENOUS | Status: DC | PRN
  Administered 2022-02-21: 80 ug via INTRAVENOUS
  Administered 2022-02-21 (×2): 40 ug via INTRAVENOUS

## 2022-02-21 MED ORDER — SODIUM CHLORIDE 0.9 % IV SOLN
Status: DC | PRN
  Administered 2022-02-21: 1012 mL

## 2022-02-21 MED ORDER — PROPOFOL 10 MG/ML IV BOLUS
INTRAVENOUS | Status: AC
  Filled 2022-02-21: qty 20

## 2022-02-21 MED ORDER — TRANEXAMIC ACID-NACL 1000-0.7 MG/100ML-% IV SOLN
1000.0000 mg | INTRAVENOUS | Status: AC
  Administered 2022-02-21: 1000 mg via INTRAVENOUS

## 2022-02-21 MED ORDER — BUPIVACAINE HCL (PF) 0.5 % IJ SOLN
INTRAMUSCULAR | Status: DC | PRN
  Administered 2022-02-21: 10 mL via PERINEURAL

## 2022-02-21 MED ORDER — LIDOCAINE HCL (PF) 1 % IJ SOLN
INTRAMUSCULAR | Status: DC | PRN
  Administered 2022-02-21: 4 mL via SUBCUTANEOUS

## 2022-02-21 MED ORDER — FENTANYL CITRATE (PF) 100 MCG/2ML IJ SOLN: 25.0000 ug | INTRAMUSCULAR | Status: DC | PRN

## 2022-02-21 MED ORDER — CEFAZOLIN SODIUM-DEXTROSE 2-4 GM/100ML-% IV SOLN
2.0000 g | Freq: Once | INTRAVENOUS | Status: AC
  Administered 2022-02-21: 2 g via INTRAVENOUS

## 2022-02-21 MED ORDER — OXYCODONE HCL 5 MG PO TABS: 5.0000 mg | ORAL_TABLET | ORAL | 0 refills | Status: DC | PRN

## 2022-02-21 MED ORDER — DEXAMETHASONE SODIUM PHOSPHATE 10 MG/ML IJ SOLN
INTRAMUSCULAR | Status: DC | PRN
  Administered 2022-02-21: 10 mg via INTRAVENOUS

## 2022-02-21 MED ORDER — TRANEXAMIC ACID-NACL 1000-0.7 MG/100ML-% IV SOLN
INTRAVENOUS | Status: AC
  Filled 2022-02-21: qty 100

## 2022-02-21 MED ORDER — NEOMYCIN-POLYMYXIN B GU 40-200000 IR SOLN
Status: AC
  Filled 2022-02-21: qty 20

## 2022-02-21 MED ORDER — VANCOMYCIN HCL 1000 MG IV SOLR
INTRAVENOUS | Status: DC | PRN
  Administered 2022-02-21: 1000 mg

## 2022-02-21 MED ORDER — FENTANYL CITRATE PF 50 MCG/ML IJ SOSY
PREFILLED_SYRINGE | INTRAMUSCULAR | Status: AC
  Administered 2022-02-21: 50 ug
  Filled 2022-02-21: qty 1

## 2022-02-21 MED ORDER — SODIUM CHLORIDE 0.9 % IV SOLN: Status: DC | PRN

## 2022-02-21 MED ORDER — ROCURONIUM BROMIDE 100 MG/10ML IV SOLN
INTRAVENOUS | Status: DC | PRN
  Administered 2022-02-21: 40 mg via INTRAVENOUS
  Administered 2022-02-21: 50 mg via INTRAVENOUS
  Administered 2022-02-21 (×2): 10 mg via INTRAVENOUS

## 2022-02-21 MED ORDER — STERILE WATER FOR IRRIGATION IR SOLN
Status: DC | PRN
  Administered 2022-02-21: 1000 mL

## 2022-02-21 MED ORDER — PHENYLEPHRINE HCL-NACL 20-0.9 MG/250ML-% IV SOLN
INTRAVENOUS | Status: DC | PRN
  Administered 2022-02-21: 20 ug/min via INTRAVENOUS

## 2022-02-21 MED ORDER — HYDROMORPHONE HCL 1 MG/ML IJ SOLN
INTRAMUSCULAR | Status: AC
  Filled 2022-02-21: qty 1

## 2022-02-21 MED ORDER — MIDAZOLAM HCL 2 MG/2ML IJ SOLN
INTRAMUSCULAR | Status: AC
  Administered 2022-02-21: 1 mg
  Filled 2022-02-21: qty 2

## 2022-02-21 MED ORDER — BUPIVACAINE LIPOSOME 1.3 % IJ SUSP
INTRAMUSCULAR | Status: DC | PRN
  Administered 2022-02-21: 20 mL via PERINEURAL

## 2022-02-21 MED ORDER — ROCURONIUM BROMIDE 10 MG/ML (PF) SYRINGE
PREFILLED_SYRINGE | INTRAVENOUS | Status: AC
  Filled 2022-02-21: qty 10

## 2022-02-21 MED ORDER — CEFAZOLIN SODIUM-DEXTROSE 2-4 GM/100ML-% IV SOLN
INTRAVENOUS | Status: AC
  Filled 2022-02-21: qty 100

## 2022-02-21 MED ORDER — BUPIVACAINE HCL (PF) 0.5 % IJ SOLN
INTRAMUSCULAR | Status: AC
  Filled 2022-02-21: qty 30

## 2022-02-21 MED ORDER — ACETAMINOPHEN 500 MG PO TABS: 1000.0000 mg | ORAL_TABLET | Freq: Three times a day (TID) | ORAL | 2 refills | Status: AC

## 2022-02-21 MED ORDER — ONDANSETRON HCL 4 MG/2ML IJ SOLN
INTRAMUSCULAR | Status: AC
  Filled 2022-02-21: qty 2

## 2022-02-21 MED ORDER — DEXAMETHASONE SODIUM PHOSPHATE 10 MG/ML IJ SOLN
INTRAMUSCULAR | Status: AC
  Filled 2022-02-21: qty 1

## 2022-02-21 MED ORDER — FAMOTIDINE 20 MG PO TABS
ORAL_TABLET | ORAL | Status: AC
  Administered 2022-02-21: 20 mg via ORAL
  Filled 2022-02-21: qty 1

## 2022-02-21 MED ORDER — LIDOCAINE HCL (CARDIAC) PF 100 MG/5ML IV SOSY
PREFILLED_SYRINGE | INTRAVENOUS | Status: DC | PRN
  Administered 2022-02-21: 100 mg via INTRAVENOUS

## 2022-02-21 MED ORDER — LIDOCAINE HCL (PF) 1 % IJ SOLN
INTRAMUSCULAR | Status: AC
  Filled 2022-02-21: qty 5

## 2022-02-21 MED ORDER — BUPIVACAINE HCL (PF) 0.5 % IJ SOLN
INTRAMUSCULAR | Status: AC
  Filled 2022-02-21: qty 10

## 2022-02-21 MED ORDER — ONDANSETRON 4 MG PO TBDP: 4.0000 mg | ORAL_TABLET | Freq: Three times a day (TID) | ORAL | 0 refills | Status: DC | PRN

## 2022-02-21 MED ORDER — PHENYLEPHRINE HCL-NACL 20-0.9 MG/250ML-% IV SOLN
INTRAVENOUS | Status: AC
  Filled 2022-02-21: qty 250

## 2022-02-21 MED ORDER — ONDANSETRON HCL 4 MG/2ML IJ SOLN: 4.0000 mg | Freq: Once | INTRAMUSCULAR | Status: DC | PRN

## 2022-02-21 MED ORDER — PROPOFOL 10 MG/ML IV BOLUS
INTRAVENOUS | Status: DC | PRN
  Administered 2022-02-21: 50 mg via INTRAVENOUS
  Administered 2022-02-21: 150 mg via INTRAVENOUS

## 2022-02-21 MED ORDER — TRANEXAMIC ACID-NACL 1000-0.7 MG/100ML-% IV SOLN: 1000.0000 mg | INTRAVENOUS | Status: DC

## 2022-02-21 MED ORDER — SUGAMMADEX SODIUM 200 MG/2ML IV SOLN
INTRAVENOUS | Status: DC | PRN
  Administered 2022-02-21: 210 mg via INTRAVENOUS

## 2022-02-21 MED ORDER — ONDANSETRON HCL 4 MG/2ML IJ SOLN
INTRAMUSCULAR | Status: DC | PRN
  Administered 2022-02-21: 4 mg via INTRAVENOUS

## 2022-02-21 MED ORDER — CHLORHEXIDINE GLUCONATE 0.12 % MT SOLN
OROMUCOSAL | Status: AC
  Administered 2022-02-21: 15 mL via OROMUCOSAL
  Filled 2022-02-21: qty 15

## 2022-02-21 MED ORDER — VANCOMYCIN HCL 1000 MG IV SOLR
INTRAVENOUS | Status: AC
  Filled 2022-02-21: qty 20

## 2022-02-21 SURGICAL SUPPLY — 93 items
ADH SKN CLS APL DERMABOND .7 (GAUZE/BANDAGES/DRESSINGS) ×1
ANCH SUT .5 CRC TPR CT 40X40 (SUTURE)
ANCH SUT 1.4 SUT TPE BLK/WHT (SUTURE)
APL PRP STRL LF DISP 70% ISPRP (MISCELLANEOUS) ×1
AUG BASEPLATE 15DEG 25 WEDGE (Joint) ×1 IMPLANT
AUGMENT BASEPLATE 15DEG 25 WDG (Joint) IMPLANT
BIT DRILL 12.7X2STRG SHNK (BIT) IMPLANT
BIT DRILL 2.0 (BIT) ×2
BIT DRILL 3.2 PERIPHERAL SCREW (BIT) IMPLANT
BIT DRL 12.7X2STRG SHNK (BIT) ×2
BLADE SAGITTAL WIDE XTHICK NO (BLADE) ×1 IMPLANT
BSPLAT GLND 15D 25 FULL WDG (Joint) ×1 IMPLANT
CEMENT BONE 40GM (Cement) IMPLANT
CHLORAPREP W/TINT 26 (MISCELLANEOUS) ×1 IMPLANT
CNTNR URN SCR LID CUP LEK RST (MISCELLANEOUS) ×1 IMPLANT
CONT SPEC 4OZ STRL OR WHT (MISCELLANEOUS) ×1
COOLER POLAR GLACIER W/PUMP (MISCELLANEOUS) ×1 IMPLANT
COVER BACK TABLE REUSABLE LG (DRAPES) ×1 IMPLANT
DERMABOND ADVANCED .7 DNX12 (GAUZE/BANDAGES/DRESSINGS) ×1 IMPLANT
DRAPE 3/4 80X56 (DRAPES) ×2 IMPLANT
DRAPE INCISE IOBAN 66X45 STRL (DRAPES) ×2 IMPLANT
DRAPE U-SHAPE 47X51 STRL (DRAPES) ×1 IMPLANT
DRSG OPSITE POSTOP 3X4 (GAUZE/BANDAGES/DRESSINGS) ×1 IMPLANT
DRSG OPSITE POSTOP 4X6 (GAUZE/BANDAGES/DRESSINGS) ×1 IMPLANT
DRSG OPSITE POSTOP 4X8 (GAUZE/BANDAGES/DRESSINGS) ×1 IMPLANT
DRSG TEGADERM 2-3/8X2-3/4 SM (GAUZE/BANDAGES/DRESSINGS) IMPLANT
DRSG TEGADERM 4X10 (GAUZE/BANDAGES/DRESSINGS) ×3 IMPLANT
ELECT REM PT RETURN 9FT ADLT (ELECTROSURGICAL) ×1
ELECTRODE REM PT RTRN 9FT ADLT (ELECTROSURGICAL) ×1 IMPLANT
GAUZE XEROFORM 1X8 LF (GAUZE/BANDAGES/DRESSINGS) ×1 IMPLANT
GLENOSPHERE REV SHOULDER 36 (Joint) IMPLANT
GLOVE BIOGEL PI IND STRL 8 (GLOVE) ×2 IMPLANT
GLOVE PI ULTRA LF STRL 7.5 (GLOVE) ×2 IMPLANT
GLOVE SURG ORTHO 8.0 STRL STRW (GLOVE) ×2 IMPLANT
GLOVE SURG SYN 8.0 (GLOVE) ×1 IMPLANT
GLOVE SURG SYN 8.0 PF PI (GLOVE) ×1 IMPLANT
GOWN STRL REUS W/ TWL LRG LVL3 (GOWN DISPOSABLE) ×2 IMPLANT
GOWN STRL REUS W/ TWL XL LVL3 (GOWN DISPOSABLE) ×1 IMPLANT
GOWN STRL REUS W/TWL LRG LVL3 (GOWN DISPOSABLE) ×2
GOWN STRL REUS W/TWL XL LVL3 (GOWN DISPOSABLE) ×1
GUIDE GLENOID PATIENT REVERSED (MISCELLANEOUS) IMPLANT
GUIDEPIN HUM 3X100 (PIN) IMPLANT
GUIDEWIRE GLENOID 2.5X220 (WIRE) IMPLANT
HEMOVAC 400CC 10FR (MISCELLANEOUS) ×1 IMPLANT
HOOD PEEL AWAY T7 (MISCELLANEOUS) ×3 IMPLANT
INSERT REVERSED HUMERAL SIZE 1 (Orthopedic Implant) IMPLANT
KIT STABILIZATION SHOULDER (MISCELLANEOUS) ×1 IMPLANT
MANIFOLD NEPTUNE II (INSTRUMENTS) ×1 IMPLANT
MASK FACE SPIDER DISP (MASK) ×1 IMPLANT
MAT ABSORB  FLUID 56X50 GRAY (MISCELLANEOUS) ×2
MAT ABSORB FLUID 56X50 GRAY (MISCELLANEOUS) ×2 IMPLANT
NDL REVERSE CUT 1/2 CRC (NEEDLE) ×1 IMPLANT
NDL SPNL 20GX3.5 QUINCKE YW (NEEDLE) ×1 IMPLANT
NEEDLE REVERSE CUT 1/2 CRC (NEEDLE) ×1 IMPLANT
NEEDLE SPNL 20GX3.5 QUINCKE YW (NEEDLE) ×1 IMPLANT
NS IRRIG 1000ML POUR BTL (IV SOLUTION) ×1 IMPLANT
PACK ARTHROSCOPY SHOULDER (MISCELLANEOUS) ×1 IMPLANT
PAD WRAPON POLAR SHDR XLG (MISCELLANEOUS) ×1 IMPLANT
PULSAVAC PLUS IRRIG FAN TIP (DISPOSABLE) ×1
RESTRICTOR CEMENT (Cement) IMPLANT
SCREW 5.0X38 SMALL F/PERFORM (Screw) IMPLANT
SCREW 5.5X22 (Screw) IMPLANT
SCREW 5.5X26 (Screw) IMPLANT
SCREW BONE THREAD 6.5X35 (Screw) IMPLANT
SLING ULTRA II LG (MISCELLANEOUS) ×1 IMPLANT
SLING ULTRA II M (MISCELLANEOUS) ×1 IMPLANT
SPONGE GAUZE 2X2 8PLY STRL LF (GAUZE/BANDAGES/DRESSINGS) IMPLANT
SPONGE T-LAP 18X18 ~~LOC~~+RFID (SPONGE) ×5 IMPLANT
STEM HUMERAL PLUS LONG SZ2 (Orthopedic Implant) IMPLANT
STRAP SAFETY 5IN WIDE (MISCELLANEOUS) ×2 IMPLANT
SUT ETHIBOND 5-0 MS/4 CCS GRN (SUTURE) ×2
SUT FIBERWIRE #2 38 BLUE 1/2 (SUTURE) ×6
SUT MNCRL AB 4-0 PS2 18 (SUTURE) IMPLANT
SUT PROLENE 6 0 P 1 18 (SUTURE) ×1 IMPLANT
SUT TICRON 2-0 30IN 311381 (SUTURE) ×3 IMPLANT
SUT VIC AB 0 CT1 36 (SUTURE) ×1 IMPLANT
SUT VIC AB 2-0 CT2 27 (SUTURE) ×2 IMPLANT
SUT XBRAID 1.4 BLK/WHT (SUTURE) IMPLANT
SUT XBRAID 1.4 BLUE (SUTURE) IMPLANT
SUT XBRAID 1.4 WHITE/BLUE (SUTURE) IMPLANT
SUT XBRAID 2 BLACK/BLUE (SUTURE) IMPLANT
SUTURE ETHBND 5-0 MS/4 CCS GRN (SUTURE) IMPLANT
SUTURE FIBERWR #2 38 BLUE 1/2 (SUTURE) ×4 IMPLANT
SYR 20ML LL LF (SYRINGE) ×1 IMPLANT
SYR 30ML LL (SYRINGE) ×2 IMPLANT
SYR TOOMEY IRRIG 70ML (MISCELLANEOUS) ×1
SYRINGE TOOMEY IRRIG 70ML (MISCELLANEOUS) IMPLANT
SYSTEM VACUUM CEMENT MIXING (MISCELLANEOUS) IMPLANT
TIP FAN IRRIG PULSAVAC PLUS (DISPOSABLE) ×1 IMPLANT
TRAP FLUID SMOKE EVACUATOR (MISCELLANEOUS) ×1 IMPLANT
TRAY FOLEY SLVR 16FR LF STAT (SET/KITS/TRAYS/PACK) IMPLANT
WATER STERILE IRR 500ML POUR (IV SOLUTION) ×1 IMPLANT
WRAPON POLAR PAD SHDR XLG (MISCELLANEOUS) ×1

## 2022-02-21 NOTE — Anesthesia Preprocedure Evaluation (Signed)
Anesthesia Evaluation  Patient identified by MRN, date of birth, ID band Patient awake    Reviewed: Allergy & Precautions, NPO status , Patient's Chart, lab work & pertinent test results  History of Anesthesia Complications Negative for: history of anesthetic complications  Airway Mallampati: III  TM Distance: >3 FB Neck ROM: full    Dental no notable dental hx.    Pulmonary neg pulmonary ROS   Pulmonary exam normal        Cardiovascular negative cardio ROS Normal cardiovascular exam     Neuro/Psych  PSYCHIATRIC DISORDERS  Depression    negative neurological ROS     GI/Hepatic negative GI ROS, Neg liver ROS,,,  Endo/Other  negative endocrine ROS    Renal/GU      Musculoskeletal   Abdominal  (+) + obese  Peds  Hematology negative hematology ROS (+)   Anesthesia Other Findings Elevated blood pressure today. No signs of end-organ damage. Denies CP, SOB, Vision changes  Past Medical History: No date: Allergy No date: Depression No date: Insomnia No date: Motion sickness     Comment:  car - back seat  Past Surgical History: 2010: COLONOSCOPY     Comment:  cleared for 10 years  BMI    Body Mass Index: 35.35 kg/m      Reproductive/Obstetrics negative OB ROS                             Anesthesia Physical Anesthesia Plan  ASA: 2  Anesthesia Plan: General   Post-op Pain Management: Regional block*   Induction: Intravenous  PONV Risk Score and Plan: Ondansetron, Dexamethasone, Midazolam and Treatment may vary due to age or medical condition  Airway Management Planned: Oral ETT  Additional Equipment:   Intra-op Plan:   Post-operative Plan: Extubation in OR  Informed Consent: I have reviewed the patients History and Physical, chart, labs and discussed the procedure including the risks, benefits and alternatives for the proposed anesthesia with the patient or authorized  representative who has indicated his/her understanding and acceptance.     Dental advisory given  Plan Discussed with: Anesthesiologist, CRNA and Surgeon  Anesthesia Plan Comments:         Anesthesia Quick Evaluation

## 2022-02-21 NOTE — Transfer of Care (Signed)
Immediate Anesthesia Transfer of Care Note  Patient: Rachel Burton  Procedure(s) Performed: Left reverse shoulder arthroplasty, biceps tenodesis (Left: Shoulder)  Patient Location: PACU  Anesthesia Type:General  Level of Consciousness: awake, alert , and oriented  Airway & Oxygen Therapy: Patient Spontanous Breathing and Patient connected to face mask oxygen  Post-op Assessment: Report given to RN and Post -op Vital signs reviewed and stable  Post vital signs: Reviewed and stable  Last Vitals:  Vitals Value Taken Time  BP    Temp    Pulse    Resp    SpO2      Last Pain:  Vitals:   02/21/22 0853  TempSrc:   PainSc: 0-No pain         Complications: No notable events documented.

## 2022-02-21 NOTE — H&P (Signed)
Paper H&P to be scanned into permanent record. H&P reviewed. No significant changes noted.  

## 2022-02-21 NOTE — Evaluation (Signed)
Physical Therapy Evaluation Patient Details Name: Rachel Burton MRN: [id number] DOB: November 29, 1955 Today's Date: 02/21/2022  History of Present Illness  Pt is a 67 y.o. female s/p L shoulder reverse total shoulder arthroplasty and L biceps tenodesis 02/21/22 d/t L shoulder severe glenohumeral OA with glenoid deformity and L shoulder biceps tendinopathy; pt with interscalene brachial plexus block.  PMH includes depression, insomnia, motion sickness.  Clinical Impression  Prior to surgery, pt was independent with functional mobility; lives in a 1 level home with level entry.  Currently pt is CGA progressing to SBA with transfers and CGA with ambulation.  Pt walked 120 feet x2 (no AD use) but pt with mild increased B LE BOS and mild increased B lateral sway (pt reporting feeling "drunk" and pt also reporting feeling really tired and closing her eyes at times during session when sitting--pt s/p general anesthesia today; nurse aware).  D/t these concerns, educated pt's friend on performing hand hold assist with pt's R UE and holding pt's pants with L hand for safety when walking (pt's friend able to safely demonstrate this technique x40 feet).  Anticipate pt will continue to improve but recommend initial 24/7 physical assist with functional mobility for pt safety--pt verbalizing understanding and pt's friend reports no questions or concerns regarding providing above recommended assist.  No c/o pain during session.  Educated pt and pt's friend on home safety, fall prevention, and safe car transfers: both verbalizing appropriate understanding.  Pt would benefit from skilled PT to address noted impairments and functional limitations (see below for any additional details).  Upon hospital discharge, pt would benefit from OP PT (pt reports having OP PT set up for Tuesday).    Recommendations for follow up therapy are one component of a multi-disciplinary discharge planning process, led by the attending physician.   Recommendations may be updated based on patient status, additional functional criteria and insurance authorization.  Follow Up Recommendations Outpatient PT      Assistance Recommended at Discharge Frequent or constant Supervision/Assistance  Patient can return home with the following  A little help with walking and/or transfers;A little help with bathing/dressing/bathroom;Assistance with cooking/housework;Assist for transportation;Help with stairs or ramp for entrance    Equipment Recommendations None recommended by PT  Recommendations for Other Services  OT consult    Functional Status Assessment Patient has had a recent decline in their functional status and demonstrates the ability to make significant improvements in function in a reasonable and predictable amount of time.     Precautions / Restrictions Precautions Precautions: Fall;Shoulder Shoulder Interventions: Don joy ultra sling;Shoulder abduction pillow;Off for dressing/bathing/exercises;At all times Restrictions Weight Bearing Restrictions: Yes LUE Weight Bearing: Non weight bearing      Mobility  Bed Mobility               General bed mobility comments: Deferred (pt in recliner beginning/end of session)    Transfers Overall transfer level: Needs assistance Equipment used: None, 1 person hand held assist Transfers: Sit to/from Stand Sit to Stand: Min guard, Supervision           General transfer comment: x3 transfers from recliner; fairly strong stand and steady    Ambulation/Gait Ambulation/Gait assistance: Min guard Gait Distance (Feet):  (120 feet x2 no AD use; 40 feet hand hold assist) Assistive device: None, 1 person hand held assist   Gait velocity: mildly decreased     General Gait Details: step through gait pattern; mild increased B LE BOS and lateral sway but no loss  of balance noted  Stairs Stairs:  (Deferred d/t pt does not have any steps at home)          Wheelchair  Mobility    Modified Rankin (Stroke Patients Only)       Balance Overall balance assessment: Needs assistance Sitting-balance support: No upper extremity supported, Feet supported Sitting balance-Leahy Scale: Good Sitting balance - Comments: steady sitting reaching within BOS   Standing balance support: No upper extremity supported, During functional activity Standing balance-Leahy Scale: Good Standing balance comment: no loss of balance noted during ambulation but pt reporting feeling "drunk"                             Pertinent Vitals/Pain Pain Assessment Pain Assessment: No/denies pain Vitals (HR and O2 on room air) stable and WFL throughout treatment session.    Home Living Family/patient expects to be discharged to:: Private residence Living Arrangements: Alone Available Help at Discharge: Friend(s);Available 24 hours/day Type of Home: House Home Access: Level entry       Home Layout: One level Home Equipment: Grab bars - toilet;Grab bars - tub/shower;Shower seat - built in      Prior Function Prior Level of Function : Independent/Modified Independent;Driving             Mobility Comments: No recent falls. ADLs Comments: Retired Customer service manager   Dominant Hand: Right    Extremity/Trunk Assessment   Upper Extremity Assessment Upper Extremity Assessment: Defer to OT evaluation LUE Deficits / Details: NWB L UE LUE: Unable to fully assess due to immobilization    Lower Extremity Assessment Lower Extremity Assessment: Overall WFL for tasks assessed    Cervical / Trunk Assessment Cervical / Trunk Assessment: Normal  Communication   Communication: No difficulties  Cognition Arousal/Alertness:  (pt appearing drowsy intermittently during session) Behavior During Therapy: WFL for tasks assessed/performed Overall Cognitive Status: Within Functional Limits for tasks assessed                                           General Comments General comments (skin integrity, edema, etc.): L UE in shoulder sling/immobilizer.  Nursing cleared pt for participation in physical therapy.  Pt agreeable to PT session.  Pt's friend Florentina Jenny present during session.    Exercises     Assessment/Plan    PT Assessment Patient needs continued PT services  PT Problem List Decreased balance;Decreased mobility;Decreased skin integrity;Pain       PT Treatment Interventions DME instruction;Gait training;Functional mobility training;Therapeutic activities;Therapeutic exercise;Balance training;Patient/family education    PT Goals (Current goals can be found in the Care Plan section)  Acute Rehab PT Goals Patient Stated Goal: to improve L UE function PT Goal Formulation: With patient Time For Goal Achievement: 03/07/22 Potential to Achieve Goals: Good    Frequency BID     Co-evaluation               AM-PAC PT "6 Clicks" Mobility  Outcome Measure Help needed turning from your back to your side while in a flat bed without using bedrails?: None Help needed moving from lying on your back to sitting on the side of a flat bed without using bedrails?: A Little Help needed moving to and from a bed to a chair (including a wheelchair)?: A Little Help needed standing up from  a chair using your arms (e.g., wheelchair or bedside chair)?: A Little Help needed to walk in hospital room?: A Little Help needed climbing 3-5 steps with a railing? : A Little 6 Click Score: 19    End of Session Equipment Utilized During Treatment: Gait belt;Other (comment) (L UE sling/shoulder immobilizer) Activity Tolerance: Patient tolerated treatment well Patient left: in chair;with call bell/phone within reach;with family/visitor present Nurse Communication: Mobility status;Precautions PT Visit Diagnosis: Other abnormalities of gait and mobility (R26.89);Pain Pain - Right/Left: Left Pain - part of body: Shoulder    Time: [id number] PT Time  Calculation (min) (ACUTE ONLY): 20 min   Charges:   PT Evaluation $PT Eval Low Complexity: 1 Low         Sun Kihn, PT 02/21/22, 4:49 PM

## 2022-02-21 NOTE — Op Note (Signed)
Operative Note    SURGERY DATE: 02/21/2022   PRE-OP DIAGNOSIS:  1. Left shoulder severe glenohumeral osteoarthritis with glenoid deformity 2. Left shoulder biceps tendinopathy   POST-OP DIAGNOSIS:  1. Left shoulder severe glenohumeral osteoarthritis with glenoid deformity 2. Left shoulder biceps tendinopathy  PROCEDURES:  1. Left reverse total shoulder arthroplasty 2. Left biceps tenodesis   SURGEON: Cato Mulligan, MD   ASSISTANT: Anitra Lauth, PA; Linward Natal, PA-S   ANESTHESIA: Gen + Exparel interscalene block   ESTIMATED BLOOD LOSS: 100cc   TOTAL IV FLUIDS: See anesthesia record  IMPLANTS: Tornier: Perform plus reversed full wedge augment baseplate 15 degree with 6.5 x 66m central screw; posterior compression screw, superior locking screw, inferior locking screw, and anterior locking screw to glenoid baseplate; 36 mm standard glenosphere; size 2+ long perform humeral stem with 0 poly insert   INDICATION(S): Rachel Hohneris a 67y.o. female with over 3 years of worsening left shoulder pain and stiffness.  Conservative measures including medications, physical therapy, and cortisone injections have not provided adequate relief.  Preoperative imaging was consistent with severe glenohumeral arthritis.  After discussion of risks, benefits, and alternatives to surgery, the patient elected to proceed with reverse shoulder arthroplasty and biceps tenodesis.   OPERATIVE FINDINGS: Severe glenohumeral osteoarthritis with glenoid deformity; significant biceps tendinopathy   OPERATIVE REPORT:   I identified Rachel Forehandin the pre-operative holding area. Informed consent was obtained and the surgical site was marked. I reviewed the risks and benefits of the proposed surgical intervention and the patient (and/or patient's guardian) wished to proceed. An Exparel interscalene block was administered by the Anesthesia team. The patient was transferred to the operative suite and general  anesthesia was administered. The patient was placed in the beach chair position with the head of the bed elevated approximately 45 degrees. All down side pressure points were appropriately padded. Pre-op exam under anesthesia confirmed stiffness and crepitus. Appropriate IV antibiotics were administered. Tranexamic acid was also administered. The extremity was then prepped and draped in standard fashion. A time out was performed confirming the correct extremity, correct patient, and correct procedure.   We used the standard deltopectoral incision from the coracoid to ~12cm distal. We found the cephalic vein and took it laterally.  We opened the deltopectoral interval widely and placed retractors under the CA ligament in the subacromial space and under the deltoid tendon at its insertion. We then abducted and internally rotated the arm and released the underlying bursa between these retractors, taking care not to damage the circumflex branch of the axillary nerve.   Next, we brought the arm back in adduction at slight forward flexion with external rotation. We opened the clavipectoral fascia lateral to the conjoint tendon. We gently palpated the axillary nerve and verified its position and continuity on both sides of the humerus with a Tug test. Note, this test was repeated multiple times during the procedure for nerve localization and confirmed to be intact at the end of the case. We then cauterized the anterior humeral circumflex ("Three sisters") vessels. The arm was then internally rotated, we cut the falciform ligament at approximately 1 cm of the upper portion of the pectoralis major insertion. Next we unroofed the bicipital groove. We proceeded with a soft tissue biceps tenodesis given the pathology of the tendon.  After opening the biceps tendon sheath all the way to the supraglenoid tubercle, we performed a biceps tenodesis with two #2 TiCron sutures to the upper border of the pectoralis major.  The  proximal portion of the tendon was excised.   Next, the remnants of the anterior capsule and subscapularis were peeled off of the lesser tuberosity. We released the inferior capsule from the humerus all the way to the posterior band of the inferior glenohumeral ligament. When this was complete we gently dislocated the shoulder up into the wound. We removed any osteophytes and made our cut with the appropriate inclination in 20 degrees of retroversion.   We then turned our attention to the glenoid. The proximal humerus was retracted posteriorly. The anterior capsule was dissected free from the retracted subscapularis. The anterior capsule was then excised, exposing the anterior glenoid. We then grasped the labrum and removed it circumferentially. During the glenoid exposure, the axillary nerve was protected the entire time.     A patient-specific guide was used to drill the central guidepin. The angled reamer was used to gently ream the augment side superiorly.  The full wedge glenoid trial was placed with maximum augment posteriorly, consistent with preoperative plan, and this achieved appropriate fit.  Osteophytes were carefully removed from the glenoid.  Standard instrumentation was carried out.  The baseplate with 6.5 mm central screw was placed.  This allowed for excellent compression of the baseplate to the glenoid with appropriate seating. Next the compression screw was drilled and placed.  The remaining locking screws were also placed. The peripheral reamer was used to ensure appropriate glenosphere seating.  Next, the glenosphere was impacted into place.  The central screw of the glenosphere was tightened.   We then turned our attention back to the humerus.  In bringing the humerus out anteriorly to allow for instrumentation, the greater tuberosity was fractured.  Therefore, four #5 Ethibond sutures were passed around the greater tuberosity at the bone-tendon junction.  Next, the humeral canal was  sequentially broached to the appropriate size.  The trial poly was placed and humerus was reduced.  The shoulder was trialed and noted to have satisfactory stability, motion, and deltoid tension with appropriate tuberosity reduction with these implants.  The trial implants were removed. The humeral canal was pulse lavaged. 3 drill holes were placed about the lesser tuberosity footprint and FiberWire sutures were passed through these holes for subscapularis repair. . Next, the humeral stem was placed with the appropriate retroversion.  However, the stem did not appear completely stable.  Therefore decision was made to cement the humeral stem.  This was carried out in a standard fashion.  This did allow for appropriate stability.  2 of the greater tuberosity Ethibond sutures were wrapped around the implant prior to final seating.  Poly insert was placed.  The humerus was reduced and motion, tension, and stability were satisfactory.  The greater tuberosity was held in a reduced position and sutures that had been passed around the implant were then tied.  FiberWire sutures were then passed through the subscapularis.  The remaining 2 Ethibond sutures were then also passed through the subscapularis.  Next, the FiberWire sutures were tied, bringing the subscapularis to the lesser tuberosity footprint.  Next, the 2 Ethibond sutures were also tied.  This allowed for appropriate reduction of the greater tuberosity and subscapularis.  Both the greater tuberosity and subscapularis were stable to external and internal rotation and forward flexion.   A Hemovac drain was placed.   We closed the deltopectoral interval with a running, 0-Vicryl suture. The skin was closed with 2-0 Vicryl, 4-0 Monocryl, and Dermabond. Honeycomb dressing was applied. A PolarCare unit and sling  were placed. Patient was extubated, transferred to a stretcher bed and to the post antesthesia care unit in stable condition.    Of note, assistance from a  PA was essential to performing the surgery.  PA was present for the entire surgery.  PA assisted with patient positioning, retraction, instrumentation, and wound closure. The surgery would have been more difficult and had longer operative time without PA assistance.    POSTOPERATIVE PLAN: The patient will be admitted with plan for discharge home on POD#0. Operative arm to remain in sling at all times except RoM exercises and hygiene. Can perform pendulums, elbow/wrist/hand RoM exercises. Passive RoM to 90 FF and 30 ER to start approximately 2 weeks after surgery. ASA 369m x 6 weeks for DVT ppx. Plan for outpatient PT starting on POD #3-4. Patient to return to clinic in ~2 weeks for post-operative appointment.

## 2022-02-21 NOTE — Evaluation (Signed)
Occupational Therapy Evaluation Patient Details Name: Rachel Burton MRN: [id number] DOB: 04-23-1955 Today's Date: 02/21/2022   History of Present Illness Pt is 67 y/o female s/p Left reverse total shoulder arthroplasty.   Clinical Impression   Upon entering the room, pt seated in recliner chair with friend, Angela Nevin, present for education. OT educating pt and friend on NWB status, sling use, polar care, ADL education, and hand/wrist/elbow exercises. Angela Nevin is able to return demonstrations and pt is fully dressed with mod A for UB self care and min A for LB self care. Pt ambulates ~ 40' to bathroom with CGA progressing to supervision. Min A for LB clothing management but pt able to perform hygiene while seated. Handout provided as well during session. Pt and caregiver felt comfortable to education. No further needs at this time. OT to complete order.      Recommendations for follow up therapy are one component of a multi-disciplinary discharge planning process, led by the attending physician.  Recommendations may be updated based on patient status, additional functional criteria and insurance authorization.   Follow Up Recommendations  No OT follow up     Assistance Recommended at Discharge Intermittent Supervision/Assistance  Patient can return home with the following A little help with walking and/or transfers;A little help with bathing/dressing/bathroom;Help with stairs or ramp for entrance;Assist for transportation;Assistance with cooking/housework    Functional Status Assessment  Patient has had a recent decline in their functional status and demonstrates the ability to make significant improvements in function in a reasonable and predictable amount of time.  Equipment Recommendations  None recommended by OT       Precautions / Restrictions Precautions Precautions: Fall;Shoulder Shoulder Interventions: Timmothy Sours joy ultra sling;Shoulder abduction pillow;Off for dressing/bathing/exercises;At  all times Restrictions Weight Bearing Restrictions: Yes LUE Weight Bearing: Non weight bearing      Mobility Bed Mobility               General bed mobility comments: seated in recliner chair    Transfers Overall transfer level: Needs assistance Equipment used: 1 person hand held assist Transfers: Sit to/from Stand Sit to Stand: Min guard                      ADL either performed or assessed with clinical judgement   ADL                                         General ADL Comments: supervision - CGA for ambulation to bathroom for toileting needs. Education completed for UB self care, sling use, and polar care with mod A overall. Min A for LB dressing and toileting needs.     Vision Patient Visual Report: No change from baseline              Pertinent Vitals/Pain Pain Assessment Pain Assessment: No/denies pain     Hand Dominance Right   Extremity/Trunk Assessment Upper Extremity Assessment Upper Extremity Assessment: LUE deficits/detail LUE Deficits / Details: NWB           Communication Communication Communication: No difficulties   Cognition Arousal/Alertness: Awake/alert Behavior During Therapy: WFL for tasks assessed/performed Overall Cognitive Status: Within Functional Limits for tasks assessed  Home Living Family/patient expects to be discharged to:: Private residence Living Arrangements: Alone Available Help at Discharge: Friend(s);Available 24 hours/day Type of Home: House Home Access: Level entry     Home Layout: One level     Bathroom Shower/Tub: Tub/shower unit         Home Equipment: None          Prior Functioning/Environment Prior Level of Function : Independent/Modified Independent;Driving               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)  Acute Rehab OT Goals Patient Stated Goal: to go home OT Goal Formulation: With patient Time For Goal Achievement: 02/21/22 Potential to Achieve Goals: Fair  OT Frequency:         AM-PAC OT "6 Clicks" Daily Activity     Outcome Measure Help from another person eating meals?: None Help from another person taking care of personal grooming?: None Help from another person toileting, which includes using toliet, bedpan, or urinal?: A Little Help from another person bathing (including washing, rinsing, drying)?: A Little Help from another person to put on and taking off regular upper body clothing?: A Lot Help from another person to put on and taking off regular lower body clothing?: A Little 6 Click Score: 19   End of Session Nurse Communication: Mobility status  Activity Tolerance: Patient tolerated treatment well Patient left: in chair;with call bell/phone within reach;with family/visitor present                   Time: [id number] OT Time Calculation (min): 29 min Charges:  OT General Charges $OT Visit: 1 Visit OT Evaluation $OT Eval Low Complexity: 1 Low OT Treatments $Self Care/Home Management : 8-22 mins  Darleen Crocker, MS, OTR/L , CBIS ascom [phone]  02/21/22, 4:10 PM

## 2022-02-21 NOTE — Discharge Instructions (Addendum)
Rachel Mulligan, MD  [facility]  Phone: [phone]  Fax: [phone]   Discharge Instructions after Reverse Shoulder Replacement    1. Activity/Sling: You are to be non-weight bearing on operative extremity. A sling/shoulder immobilizer has been provided for you. Only remove the sling to perform elbow, wrist, and hand RoM exercises and hygiene/dressing. Active reaching and lifting are not permitted. You will be given further instructions on sling use at your first physical therapy visit and postoperative visit with Dr. Posey Pronto.   2. Dressings: Dressing may be removed at 1st physical therapy visit (~3-4 days after surgery). Afterwards, you may either leave open to air (if no drainage) or cover with dry, sterile dressing. If you have steri-strips on your wound, please do not remove them. They will fall off on their own. You may shower 5 days after surgery. Please pat incision dry. Do not rub or place any shear forces across incision. If there is drainage or any opening of incision after 5 days, please notify our offices immediately.    3. Driving:  Plan on not driving for six weeks. Please note that you are advised NOT to drive while taking narcotic pain medications as you may be impaired and unsafe to drive.   4. Medications:  - You have been provided a prescription for narcotic pain medicine (usually oxycodone). After surgery, take 1-2 narcotic tablets every 4 hours if needed for severe pain. Please start this as soon as you begin to start having pain (if you received a nerve block, start taking as soon as this wears off).  - A prescription for anti-nausea medication will be provided in case the narcotic medicine causes nausea - take 1 tablet every 6 hours only if nauseated.  - Take enteric coated aspirin 325 mg once daily for 6 weeks to prevent blood clots. Do not take aspirin if you have an aspirin sensitivity/allergy or asthma or are on an anticoagulant (blood thinner) already. If so, then  your home anticoagulant will be resume and managed - do not take aspirin. -Take tylenol 1000mg  (2 Extra strength or 3 regular strength tablets) every 8 hours for pain. This will reduce the amount of narcotic medication needed. May stop tylenol when you are having minimal pain. - Take a stool softener (Colace, Dulcolax or Senakot) if you are using narcotic pain medications to help with constipation that is associated with narcotic use. - DO NOT take ANY nonsteroidal anti-inflammatory pain medications: Advil, Motrin, Ibuprofen, Aleve, Naproxen, or Naprosyn.   If you are taking prescription medication for anxiety, depression, insomnia, muscle spasm, chronic pain, or for attention deficit disorder you are advised that you are at a higher risk of adverse effects with use of narcotics post-op, including narcotic addiction/dependence, depressed breathing, death. If you use non-prescribed substances: alcohol, marijuana, cocaine, heroin, methamphetamines, etc., you are at a higher risk of adverse effects with use of narcotics post-op, including narcotic addiction/dependence, depressed breathing, death. You are advised that taking > 50 morphine milligram equivalents (MME) of narcotic pain medication per day results in twice the risk of overdose or death. For your prescription provided: oxycodone 5 mg - taking more than 6 tablets per day after the first few days of surgery.   5. Physical Therapy: 1-2 times per week for ~12 weeks. Therapy typically starts on post operative Day 3 or 4. You have been provided an order for physical therapy. The therapist will provide home exercises. Please contact our offices if this appointment has not been scheduled.  6. Work: May do light duty/desk job in approximately 2 weeks when off of narcotics, pain is well-controlled, and swelling has decreased if able to function with one arm in sling. Full work may take 6 weeks if light motions and function of both arms is required.  Lifting jobs may require 12 weeks.   7. Post-Op Appointments: Your first post-op appointment will be with Dr. Posey Pronto in approximately 2 weeks time.    If you find that they have not been scheduled please call the Orthopaedic Appointment front desk at [phone].                               Rachel Mulligan, MD [facility] Phone: [phone] Fax: [phone]   REVERSE SHOULDER ARTHROPLASTY REHAB GUIDELINES   These guidelines should be tailored to individual patients based on their rehab goals, age, precautions, quality of repair, etc.  Progression should be based on patient progress and approval by the referring physician.  PHASE 1 - Day 1 through Week 2  GENERAL GUIDELINES AND PRECAUTIONS Sling wear 24/7 except during grooming and home exercises (3 to 5 times daily) Avoid shoulder extension such that the arm is posterior the frontal plane.  When patients recline, a pillow should be placed behind the upper arm and sling should be on.  They should be advised to always be able to see the elbow Avoid combined IR/ADD/EXT, such as hand behind back to prevent dislocation Avoid combined IR and ADD such as reaching across the chest to prevent dislocation No AROM No submersion in pool/water for 4 weeks No weight bearing through operative arm (as in transfers, walker use, etc.)  GOALS Maintain integrity of joint replacement; protect soft tissue healing Increase PROM for elevation to 120 and ER to 30 (will remain the goal for first 6 weeks) Optimize distal UE circulation and muscle activity (elbow, wrist and hand) Instruct in use of sling for proper fit, polar care device for ice application after HEP, signs/symptoms of infection  EXERCISES Active elbow, wrist and hand No passive stretching/ROM until 2 weeks postop Active scapular retraction with arms resting in neutral position  CRITERIA TO PROGRESS TO PHASE 2 Low pain (less than 3/10) with shoulder  PROM Healing of incision without signs of infection Clearance by MD to advance after 2 week MD check up  PHASE 2 - 2 weeks - 6 weeks  GENERAL GUIDELINES AND PRECAUTIONS Sling may be removed while at home; worn in community without abduction pillow May use arm for light activities of daily living (such as feeding, brushing teeth, dressing.) with elbow near  the side of the body  and arm in front of the body- no active lifting of the arm May submerge in water (tub, pool, [place], etc.) after 4 weeks Continue to avoid WBing through the operative arm Continue to avoid combined IR/EXT/ADD (hand behind the back) and IR/ADD  (reaching across chest) for dislocation precautions  GOALS  Achieve passive elevation to 120 and ER to 30  Low (less than 3/10) to no pain  Ability to fire all heads of the deltoid  EXERCISES May discontinue grip, and active elbow and wrist exercises since using the arm in ADL's  with sling removed around the home Continue passive elevation to 120 and ER to 30, both in scapular plane with arm supported on table top Add submaximal isometrics, pain free effort, for all functional heads of deltoid (anterior, posterior, middle)  Ensure  that with posterior deltoid isometric the shoulder does not move into extension and the arm remains anterior the frontal plane At 4 weeks:  begin to place arm in balanced position of 90 deg elevation in supine; when patient able to hold this position with ease, may begin reverse pendulums clockwise and counterclockwise  CRITERIA TO PROGRESS TO PHASE 3 Passive forward elevation in scapular plane to 120; passive ER in scapular plane to 30 Ability to fire isometrically all heads of the deltoid muscle without pain Ability to place and hold the arm in balanced position (90 deg elevation in supine)  PHASE 3 - 6 weeks to 3 months  GENERAL GUIDELINES AND PRECAUTIONS Discontinue use of sling Avoid forcing end range motion in any direction to prevent  dislocation  May advance use of the arm actively in ADL's without being restricted to arm by the side of the body, however, avoid heavy lifting and sports (forever!) May initiate functional IR behind the back gently NO UPPER BODY ERGOMETER   GOALS Optimize PROM for elevation and ER in scapular plane with realistic expectation that max  mobility for elevation is usually around 145-160 passively; ER 40 to 50 passively; functional IR to L1 Recover AROM to approach as close to PROM available as possible; may expect 135-150 deg active elevation; 30 deg active ER; active functional IR to L1 Establish dynamic stability of the shoulder with deltoid and periscapular muscle gradual strengthening  EXERCISES Forward elevation in scapular plane active progression: supine to incline, to vertical; short to long lever arm Balanced position long lever arm AROM Active ER/IR with arm at side Scapular retraction with light band resistance Functional IR with hand slide up back - very gentle and gradual NO UPPER BODY ERGOMETER     CRITERIA TO PROGRESS TO PHASE 4  AROM equals/approaches PROM with good mechanics for elevation   No pain  Higher level demand on shoulder than ADL functions   PHASE 4 12 months and beyond  GENERAL GUIDELINES AND PRECAUTIONS No heavy lifting and no overhead sports No heavy pushing activity Gradually increase strength of deltoid and scapular stabilizers; also the rotator cuff if present with weights not to exceed 5 lbs NO UPPER BODY ERGOMETER   GOALS  Optimize functional use of the operative UE to meet the desired demands  Gradual increase in deltoid, scapular muscle, and rotator cuff strength  Pain free functional activities   EXERCISES Add light hand weights for deltoid up to and not to exceed 3 lbs for anterior and posterior with long arm lift against gravity; elbow bent to 90 deg for abduction in scapular plane Theraband progression for extension to hip with scapular  depression/retraction Theraband progression for serratus anterior punches in supine; avoid wall, incline or prone pressups for serratus anterior End range stretching gently without forceful overpressure in all planes (elevation in scapular plane, ER in scapular plane, functional IR) with stretching done for life as part of a daily routine NO UPPER BODY ERGOMETER     CRITERIA FOR DISCHARGE FROM SKILLED PHYSICAL THERAPY  Pain free AROM for shoulder elevation (expect around 135-150)  Functional strength for all ADL's, work tasks, and hobbies approved by Psychologist, sport and exercise  Independence with home maintenance program   NOTES: 1. With proper exercise, motion, strength, and function continue to improve even after one year. 2. The complication rate after surgery is 5 - 8%. Complications include infection, fracture, heterotopic bone formation, nerve injury, instability, rotator cuff tear, and tuberosity nonunion. Please look for clinical signs,  unusual symptoms, or lack of progress with therapy and report those to Dr. Posey Pronto. Prefer more communication than less.  3. The therapy plan above only serves as a guide. Please be aware of specific individualized patient instructions as written on the prescription or through discussions with the surgeon. 4. Please call Dr. Posey Pronto if you have any specific questions or concerns [phone]   SHOULDER SLING IMMOBILIZER   VIDEO Slingshot 2 Shoulder Brace Application - YouTube ---[email or web address]  INSTRUCTIONS While supporting the injured arm, slide the forearm into the sling. Wrap the adjustable shoulder strap around the neck and shoulders and attach the strap end to the sling using  the "alligator strap tab."  Adjust the shoulder strap to the required length. Position the shoulder pad behind the neck. To secure the shoulder pad location (optional), pull the shoulder strap away from the shoulder pad, unfold the hook material on the top of the  pad, then press the shoulder strap back onto the hook material to secure the pad in place. Attach the closure strap across the open top of the sling. Position the strap so that it holds the arm securely in the sling. Next, attach the thumb strap to the open end of the sling between the thumb and fingers. After sling has been fit, it may be easily removed and reapplied using the quick release buckle on shoulder strap. If a neutral pillow or 15 abduction pillow is included, place the pillow at the waistline. Attach the sling to the pillow, lining up hook material on the pillow with the loop on sling. Adjust the waist strap to fit.  If waist strap is too long, cut it to fit. Use the small piece of double sided hook material (located on top of the pillow) to secure the strap end. Place the double sided hook material on the inside of the cut strap end and secure it to the waist strap.     If no pillow is included, attach the waist strap to the sling and adjust to fit.    Washing Instructions: Straps and sling must be removed and cleaned regularly depending on your activity level and perspiration. Hand wash straps and sling in cold water with mild detergent, rinse, air dry   POLAR CARE INFORMATION  [email or web address]  How to use [employer] [place]?  YouTube   [email or web address]  OPERATING INSTRUCTIONS  Start the product With dry hands, connect the transformer to the electrical connection located on the top of the cooler. Next, plug the transformer into an appropriate electrical outlet. The unit will automatically start running at this point.  To stop the pump, disconnect electrical power.  Unplug to stop the product when not in use. Unplugging the Polar Care unit turns it off. Always unplug immediately after use. Never leave it plugged in while unattended. Remove pad.    FIRST ADD WATER TO FILL LINE, THEN ICE---Replace ice when existing ice is almost  melted  1 Discuss Treatment with your [facility] Practitioner and Use Only as Prescribed 2 Apply Insulation Barrier & Cold Therapy Pad 3 Check for Moisture 4 Inspect Skin Regularly  Tips and Trouble Shooting Usage Tips 1. Use cubed or chunked ice for optimal performance. 2. It is recommended to drain the Pad between uses. To drain the pad, hold the Pad upright with the hose pointed toward the ground. Depress the black plunger and allow water to drain out. 3. You may disconnect the Pad from the unit without removing  the pad from the affected area by depressing the silver tabs on the hose coupling and gently pulling the hoses apart. The Pad and unit will seal itself and will not leak. Note: Some dripping during release is normal. 4. DO NOT RUN PUMP WITHOUT WATER! The pump in this unit is designed to run with water. Running the unit without water will cause permanent damage to the pump. 5. Unplug unit before removing lid.  TROUBLESHOOTING GUIDE Pump not running, Water not flowing to the pad, Pad is not getting cold 1. Make sure the transformer is plugged into the wall outlet. 2. Confirm that the ice and water are filled to the indicated levels. 3. Make sure there are no kinks in the pad. 4. Gently pull on the blue tube to make sure the tube/pad junction is straight. 5. Remove the pad from the treatment site and ll it while the pad is lying at; then reapply. 6. Confirm that the pad couplings are securely attached to the unit. Listen for the double clicks (Figure 1) to confirm the pad couplings are securely attached.  Leaks    Note: Some condensation on the lines, controller, and pads is unavoidable, especially in warmer climates. 1. If using a Breg Polar Care Cold Therapy unit with a detachable Cold Therapy Pad, and a leak exists (other than condensation on the lines) disconnect the pad couplings. Make sure the silver tabs on the couplings are depressed before reconnecting the pad to  the pump hose; then confirm both sides of the coupling are properly clicked in. 2. If the coupling continues to leak or a leak is detected in the pad itself, stop using it and call [facility] at [phone].  Cleaning After use, empty and dry the unit with a soft cloth. Warm water and mild detergent may be used occasionally to clean the pump and tubes.  WARNING: The [place] can be cold enough to cause serious injury, including full skin necrosis. Follow these Operating Instructions, and carefully read the Product Insert (see pouch on side of unit) and the Cold Therapy Pad Fitting Instructions (provided with each Cold Therapy Pad) prior to use.       Interscalene Nerve Block (ISNB) Discharge Instructions    For your surgery you have received an Interscalene Nerve Block. Nerve Blocks affect many types of nerves, including nerves that control movement, pain and normal sensation.  You may experience feelings such as numbness, tingling, heaviness, weakness or the inability to move your arm or the feeling or sensation that your arm has "fallen asleep". A nerve block can last for 2 - 36 hours or more depending on the medication used.  Usually the weakness wears off first.  The tingling and heaviness usually wear off next.  Finally you may start to notice pain.  Keep in mind that this may occur in any order.  once a nerve block starts to wear off it is usually completely gone within 60 minutes. ISNB may cause mild shortness of breath, a hoarse voice, blurry vision, unequal pupils, or drooping of the face on the same side as the nerve block.  These symptoms will usually go away within 12 hours.  Very rarely the procedure itself can cause mild seizures. If needed, your surgeon will give you a prescription for pain medication.  It will take about 60 minutes for the oral pain medication to become fully effective.  So, it is recommended that you start taking this medication before the nerve  block first begins to wear off, or when you first begin to feel discomfort. Keep in mind that nerve blocks often wear off in the middle of the night.   If you are going to bed and the block has not started to wear off or you have not started to have any discomfort, consider setting an alarm for 2 to 3 hours, so you can assess your block.  If you notice the block is wearing off or you are starting to have discomfort, you can take your pain medication. Take your pain medication only as prescribed.  Pain medication can cause sedation and decrease your breathing if you take more than you need for the level of pain that you have. Nausea is a common side effect of many pain medications.  You may want to eat something before taking your pain medicine to prevent nausea. After an Interscalene nerve block, you cannot feel pain, pressure or extremes in temperature in the effected arm.  Because your arm is numb it is at an increased risk for injury.  To decrease the possibility of injury, please practice the following:  While you are awake change the position of your arm frequently to prevent too much pressure on any one area for prolonged periods of time.  If you have a cast or tight dressing, check the color or your fingers every couple of hours.  Call your surgeon with the appearance of any discoloration (white or blue). If you are given a sling to wear before you go home, please wear it  at all times until the block has completely worn off.  Do not get up at night without your sling. If you experience any problems or concerns, please contact your surgeon's office. If you experience severe or prolonged shortness of breath go to the nearest emergency department.

## 2022-02-21 NOTE — Anesthesia Procedure Notes (Signed)
Anesthesia Regional Block: Interscalene brachial plexus block   Pre-Anesthetic Checklist: , timeout performed,  Correct Patient, Correct Site, Correct Laterality,  Correct Procedure, Correct Position, site marked,  Risks and benefits discussed,  Surgical consent,  Pre-op evaluation,  At surgeon's request and post-op pain management  Laterality: Left and Upper  Prep: chloraprep       Needles:  Injection technique: Single-shot  Needle Type: Stimiplex     Needle Length: 5cm  Needle Gauge: 22     Additional Needles:   Procedures:,,,, ultrasound used (permanent image in chart),,    Narrative:  Start time: 02/21/2022 8:48 AM End time: 02/21/2022 8:51 AM Injection made incrementally with aspirations every 5 mL.  Performed by: Personally  Anesthesiologist: Martha Clan, MD  Additional Notes: Functioning IV was confirmed and monitors were applied.  A 63m 22ga Stimuplex needle was used. Sterile prep and drape,hand hygiene and sterile gloves were used.  Negative aspiration and negative test dose prior to incremental administration of local anesthetic. The patient tolerated the procedure well.

## 2022-02-21 NOTE — Anesthesia Procedure Notes (Signed)
Procedure Name: Intubation Date/Time: 02/21/2022 9:40 AM  Performed by: Demetrius Charity, CRNAPre-anesthesia Checklist: Patient identified, Patient being monitored, Timeout performed, Emergency Drugs available and Suction available Patient Re-evaluated:Patient Re-evaluated prior to induction Oxygen Delivery Method: Circle system utilized Preoxygenation: Pre-oxygenation with 100% oxygen Induction Type: IV induction Ventilation: Mask ventilation without difficulty and Oral airway inserted - appropriate to patient size Laryngoscope Size: 3 and McGraph Grade View: Grade I Tube type: Oral Tube size: 6.5 mm Number of attempts: 1 Airway Equipment and Method: Stylet and Video-laryngoscopy Placement Confirmation: ETT inserted through vocal cords under direct vision, positive ETCO2 and breath sounds checked- equal and bilateral Secured at: 21 cm Tube secured with: Tape Dental Injury: Teeth and Oropharynx as per pre-operative assessment

## 2022-02-25 ENCOUNTER — Encounter: Payer: Self-pay | Admitting: Orthopedic Surgery

## 2022-02-25 DIAGNOSIS — Z96612 Presence of left artificial shoulder joint: Secondary | ICD-10-CM | POA: Diagnosis not present

## 2022-02-25 DIAGNOSIS — M25612 Stiffness of left shoulder, not elsewhere classified: Secondary | ICD-10-CM | POA: Diagnosis not present

## 2022-02-25 DIAGNOSIS — M25512 Pain in left shoulder: Secondary | ICD-10-CM | POA: Diagnosis not present

## 2022-02-25 DIAGNOSIS — M6281 Muscle weakness (generalized): Secondary | ICD-10-CM | POA: Diagnosis not present

## 2022-02-25 LAB — SURGICAL PATHOLOGY

## 2022-02-27 NOTE — Anesthesia Postprocedure Evaluation (Signed)
Anesthesia Post Note  Patient: Rachel Burton  Procedure(s) Performed: Left reverse shoulder arthroplasty, biceps tenodesis (Left: Shoulder)  Patient location during evaluation: PACU Anesthesia Type: General Level of consciousness: awake and alert Pain management: pain level controlled Vital Signs Assessment: post-procedure vital signs reviewed and stable Respiratory status: spontaneous breathing, nonlabored ventilation, respiratory function stable and patient connected to nasal cannula oxygen Cardiovascular status: blood pressure returned to baseline and stable Postop Assessment: no apparent nausea or vomiting Anesthetic complications: no   No notable events documented.   Last Vitals:  Vitals:   02/21/22 1533 02/21/22 1648  BP: (!) 158/88 (!) 144/78  Pulse: 73 72  Resp: 18 16  Temp: 37.1 C   SpO2: 92% 93%    Last Pain:  Vitals:   02/21/22 1533  TempSrc: Temporal  PainSc: 0-No pain                 Martha Clan

## 2022-03-04 DIAGNOSIS — Z96612 Presence of left artificial shoulder joint: Secondary | ICD-10-CM | POA: Diagnosis not present

## 2022-03-04 DIAGNOSIS — M25612 Stiffness of left shoulder, not elsewhere classified: Secondary | ICD-10-CM | POA: Diagnosis not present

## 2022-03-04 DIAGNOSIS — M6281 Muscle weakness (generalized): Secondary | ICD-10-CM | POA: Diagnosis not present

## 2022-03-04 DIAGNOSIS — M25512 Pain in left shoulder: Secondary | ICD-10-CM | POA: Diagnosis not present

## 2022-03-06 DIAGNOSIS — M25612 Stiffness of left shoulder, not elsewhere classified: Secondary | ICD-10-CM | POA: Diagnosis not present

## 2022-03-06 DIAGNOSIS — M25512 Pain in left shoulder: Secondary | ICD-10-CM | POA: Diagnosis not present

## 2022-03-06 DIAGNOSIS — M6281 Muscle weakness (generalized): Secondary | ICD-10-CM | POA: Diagnosis not present

## 2022-03-06 DIAGNOSIS — Z96612 Presence of left artificial shoulder joint: Secondary | ICD-10-CM | POA: Diagnosis not present

## 2022-03-13 DIAGNOSIS — M25512 Pain in left shoulder: Secondary | ICD-10-CM | POA: Diagnosis not present

## 2022-03-13 DIAGNOSIS — Z96612 Presence of left artificial shoulder joint: Secondary | ICD-10-CM | POA: Diagnosis not present

## 2022-03-13 DIAGNOSIS — M6281 Muscle weakness (generalized): Secondary | ICD-10-CM | POA: Diagnosis not present

## 2022-03-13 DIAGNOSIS — M25612 Stiffness of left shoulder, not elsewhere classified: Secondary | ICD-10-CM | POA: Diagnosis not present

## 2022-03-18 DIAGNOSIS — M25612 Stiffness of left shoulder, not elsewhere classified: Secondary | ICD-10-CM | POA: Diagnosis not present

## 2022-03-18 DIAGNOSIS — Z96612 Presence of left artificial shoulder joint: Secondary | ICD-10-CM | POA: Diagnosis not present

## 2022-03-18 DIAGNOSIS — M25512 Pain in left shoulder: Secondary | ICD-10-CM | POA: Diagnosis not present

## 2022-03-18 DIAGNOSIS — M6281 Muscle weakness (generalized): Secondary | ICD-10-CM | POA: Diagnosis not present

## 2022-03-27 DIAGNOSIS — M25612 Stiffness of left shoulder, not elsewhere classified: Secondary | ICD-10-CM | POA: Diagnosis not present

## 2022-03-27 DIAGNOSIS — M25512 Pain in left shoulder: Secondary | ICD-10-CM | POA: Diagnosis not present

## 2022-03-27 DIAGNOSIS — M6281 Muscle weakness (generalized): Secondary | ICD-10-CM | POA: Diagnosis not present

## 2022-03-27 DIAGNOSIS — Z96612 Presence of left artificial shoulder joint: Secondary | ICD-10-CM | POA: Diagnosis not present

## 2022-04-01 DIAGNOSIS — M25512 Pain in left shoulder: Secondary | ICD-10-CM | POA: Diagnosis not present

## 2022-04-01 DIAGNOSIS — M25612 Stiffness of left shoulder, not elsewhere classified: Secondary | ICD-10-CM | POA: Diagnosis not present

## 2022-04-01 DIAGNOSIS — Z96612 Presence of left artificial shoulder joint: Secondary | ICD-10-CM | POA: Diagnosis not present

## 2022-04-01 DIAGNOSIS — M6281 Muscle weakness (generalized): Secondary | ICD-10-CM | POA: Diagnosis not present

## 2022-04-03 DIAGNOSIS — M25612 Stiffness of left shoulder, not elsewhere classified: Secondary | ICD-10-CM | POA: Diagnosis not present

## 2022-04-03 DIAGNOSIS — Z96612 Presence of left artificial shoulder joint: Secondary | ICD-10-CM | POA: Diagnosis not present

## 2022-04-03 DIAGNOSIS — M6281 Muscle weakness (generalized): Secondary | ICD-10-CM | POA: Diagnosis not present

## 2022-04-03 DIAGNOSIS — M25512 Pain in left shoulder: Secondary | ICD-10-CM | POA: Diagnosis not present

## 2022-04-08 DIAGNOSIS — M25612 Stiffness of left shoulder, not elsewhere classified: Secondary | ICD-10-CM | POA: Diagnosis not present

## 2022-04-08 DIAGNOSIS — Z96612 Presence of left artificial shoulder joint: Secondary | ICD-10-CM | POA: Diagnosis not present

## 2022-04-08 DIAGNOSIS — M6281 Muscle weakness (generalized): Secondary | ICD-10-CM | POA: Diagnosis not present

## 2022-04-08 DIAGNOSIS — M25512 Pain in left shoulder: Secondary | ICD-10-CM | POA: Diagnosis not present

## 2022-04-10 DIAGNOSIS — M6281 Muscle weakness (generalized): Secondary | ICD-10-CM | POA: Diagnosis not present

## 2022-04-10 DIAGNOSIS — M25612 Stiffness of left shoulder, not elsewhere classified: Secondary | ICD-10-CM | POA: Diagnosis not present

## 2022-04-10 DIAGNOSIS — M25512 Pain in left shoulder: Secondary | ICD-10-CM | POA: Diagnosis not present

## 2022-04-10 DIAGNOSIS — Z96612 Presence of left artificial shoulder joint: Secondary | ICD-10-CM | POA: Diagnosis not present

## 2022-04-15 DIAGNOSIS — Z96612 Presence of left artificial shoulder joint: Secondary | ICD-10-CM | POA: Diagnosis not present

## 2022-04-15 DIAGNOSIS — M25612 Stiffness of left shoulder, not elsewhere classified: Secondary | ICD-10-CM | POA: Diagnosis not present

## 2022-04-15 DIAGNOSIS — M6281 Muscle weakness (generalized): Secondary | ICD-10-CM | POA: Diagnosis not present

## 2022-04-15 DIAGNOSIS — M25512 Pain in left shoulder: Secondary | ICD-10-CM | POA: Diagnosis not present

## 2022-04-17 DIAGNOSIS — Z96612 Presence of left artificial shoulder joint: Secondary | ICD-10-CM | POA: Diagnosis not present

## 2022-04-17 DIAGNOSIS — M25512 Pain in left shoulder: Secondary | ICD-10-CM | POA: Diagnosis not present

## 2022-04-17 DIAGNOSIS — M6281 Muscle weakness (generalized): Secondary | ICD-10-CM | POA: Diagnosis not present

## 2022-04-17 DIAGNOSIS — M25612 Stiffness of left shoulder, not elsewhere classified: Secondary | ICD-10-CM | POA: Diagnosis not present

## 2022-04-22 DIAGNOSIS — M25512 Pain in left shoulder: Secondary | ICD-10-CM | POA: Diagnosis not present

## 2022-04-22 DIAGNOSIS — M6281 Muscle weakness (generalized): Secondary | ICD-10-CM | POA: Diagnosis not present

## 2022-04-22 DIAGNOSIS — M25612 Stiffness of left shoulder, not elsewhere classified: Secondary | ICD-10-CM | POA: Diagnosis not present

## 2022-04-22 DIAGNOSIS — Z96612 Presence of left artificial shoulder joint: Secondary | ICD-10-CM | POA: Diagnosis not present

## 2022-04-23 ENCOUNTER — Ambulatory Visit: Payer: Medicare Other

## 2022-04-24 DIAGNOSIS — Z96612 Presence of left artificial shoulder joint: Secondary | ICD-10-CM | POA: Diagnosis not present

## 2022-04-24 DIAGNOSIS — M25512 Pain in left shoulder: Secondary | ICD-10-CM | POA: Diagnosis not present

## 2022-04-24 DIAGNOSIS — M25612 Stiffness of left shoulder, not elsewhere classified: Secondary | ICD-10-CM | POA: Diagnosis not present

## 2022-04-24 DIAGNOSIS — M6281 Muscle weakness (generalized): Secondary | ICD-10-CM | POA: Diagnosis not present

## 2022-04-29 ENCOUNTER — Ambulatory Visit
Admission: RE | Admit: 2022-04-29 | Discharge: 2022-04-29 | Disposition: A | Discharge status: Home / Self Care | Payer: Medicare Other | Source: Ambulatory Visit | Attending: Family Medicine | Admitting: Family Medicine

## 2022-04-29 DIAGNOSIS — Z1231 Encounter for screening mammogram for malignant neoplasm of breast: Secondary | ICD-10-CM | POA: Diagnosis not present

## 2022-04-29 DIAGNOSIS — M25612 Stiffness of left shoulder, not elsewhere classified: Secondary | ICD-10-CM | POA: Diagnosis not present

## 2022-04-29 DIAGNOSIS — Z96612 Presence of left artificial shoulder joint: Secondary | ICD-10-CM | POA: Diagnosis not present

## 2022-04-29 DIAGNOSIS — M6281 Muscle weakness (generalized): Secondary | ICD-10-CM | POA: Diagnosis not present

## 2022-04-29 DIAGNOSIS — M25512 Pain in left shoulder: Secondary | ICD-10-CM | POA: Diagnosis not present

## 2022-05-01 DIAGNOSIS — Z96612 Presence of left artificial shoulder joint: Secondary | ICD-10-CM | POA: Diagnosis not present

## 2022-05-01 DIAGNOSIS — M25512 Pain in left shoulder: Secondary | ICD-10-CM | POA: Diagnosis not present

## 2022-05-01 DIAGNOSIS — M6281 Muscle weakness (generalized): Secondary | ICD-10-CM | POA: Diagnosis not present

## 2022-05-01 DIAGNOSIS — M25612 Stiffness of left shoulder, not elsewhere classified: Secondary | ICD-10-CM | POA: Diagnosis not present

## 2022-05-06 DIAGNOSIS — M25612 Stiffness of left shoulder, not elsewhere classified: Secondary | ICD-10-CM | POA: Diagnosis not present

## 2022-05-06 DIAGNOSIS — M25512 Pain in left shoulder: Secondary | ICD-10-CM | POA: Diagnosis not present

## 2022-05-06 DIAGNOSIS — Z96612 Presence of left artificial shoulder joint: Secondary | ICD-10-CM | POA: Diagnosis not present

## 2022-05-06 DIAGNOSIS — M6281 Muscle weakness (generalized): Secondary | ICD-10-CM | POA: Diagnosis not present

## 2022-05-08 DIAGNOSIS — M25512 Pain in left shoulder: Secondary | ICD-10-CM | POA: Diagnosis not present

## 2022-05-08 DIAGNOSIS — M6281 Muscle weakness (generalized): Secondary | ICD-10-CM | POA: Diagnosis not present

## 2022-05-08 DIAGNOSIS — M25612 Stiffness of left shoulder, not elsewhere classified: Secondary | ICD-10-CM | POA: Diagnosis not present

## 2022-05-08 DIAGNOSIS — Z96612 Presence of left artificial shoulder joint: Secondary | ICD-10-CM | POA: Diagnosis not present

## 2022-05-13 DIAGNOSIS — M6281 Muscle weakness (generalized): Secondary | ICD-10-CM | POA: Diagnosis not present

## 2022-05-13 DIAGNOSIS — M25512 Pain in left shoulder: Secondary | ICD-10-CM | POA: Diagnosis not present

## 2022-05-13 DIAGNOSIS — Z96612 Presence of left artificial shoulder joint: Secondary | ICD-10-CM | POA: Diagnosis not present

## 2022-05-13 DIAGNOSIS — M25612 Stiffness of left shoulder, not elsewhere classified: Secondary | ICD-10-CM | POA: Diagnosis not present

## 2022-05-15 DIAGNOSIS — Z96612 Presence of left artificial shoulder joint: Secondary | ICD-10-CM | POA: Diagnosis not present

## 2022-05-15 DIAGNOSIS — M25512 Pain in left shoulder: Secondary | ICD-10-CM | POA: Diagnosis not present

## 2022-05-15 DIAGNOSIS — M25612 Stiffness of left shoulder, not elsewhere classified: Secondary | ICD-10-CM | POA: Diagnosis not present

## 2022-05-15 DIAGNOSIS — M6281 Muscle weakness (generalized): Secondary | ICD-10-CM | POA: Diagnosis not present

## 2022-05-19 DIAGNOSIS — M25612 Stiffness of left shoulder, not elsewhere classified: Secondary | ICD-10-CM | POA: Diagnosis not present

## 2022-05-19 DIAGNOSIS — Z96612 Presence of left artificial shoulder joint: Secondary | ICD-10-CM | POA: Diagnosis not present

## 2022-05-19 DIAGNOSIS — M6281 Muscle weakness (generalized): Secondary | ICD-10-CM | POA: Diagnosis not present

## 2022-05-19 DIAGNOSIS — M25512 Pain in left shoulder: Secondary | ICD-10-CM | POA: Diagnosis not present

## 2022-05-27 DIAGNOSIS — M6281 Muscle weakness (generalized): Secondary | ICD-10-CM | POA: Diagnosis not present

## 2022-05-27 DIAGNOSIS — M25612 Stiffness of left shoulder, not elsewhere classified: Secondary | ICD-10-CM | POA: Diagnosis not present

## 2022-05-27 DIAGNOSIS — M25512 Pain in left shoulder: Secondary | ICD-10-CM | POA: Diagnosis not present

## 2022-05-27 DIAGNOSIS — Z96612 Presence of left artificial shoulder joint: Secondary | ICD-10-CM | POA: Diagnosis not present

## 2022-05-29 DIAGNOSIS — M25512 Pain in left shoulder: Secondary | ICD-10-CM | POA: Diagnosis not present

## 2022-05-29 DIAGNOSIS — Z96612 Presence of left artificial shoulder joint: Secondary | ICD-10-CM | POA: Diagnosis not present

## 2022-05-29 DIAGNOSIS — M6281 Muscle weakness (generalized): Secondary | ICD-10-CM | POA: Diagnosis not present

## 2022-05-29 DIAGNOSIS — M25612 Stiffness of left shoulder, not elsewhere classified: Secondary | ICD-10-CM | POA: Diagnosis not present

## 2022-06-05 DIAGNOSIS — Z96612 Presence of left artificial shoulder joint: Secondary | ICD-10-CM | POA: Diagnosis not present

## 2022-07-22 DIAGNOSIS — M2141 Flat foot [pes planus] (acquired), right foot: Secondary | ICD-10-CM | POA: Diagnosis not present

## 2022-07-22 DIAGNOSIS — M76821 Posterior tibial tendinitis, right leg: Secondary | ICD-10-CM | POA: Diagnosis not present

## 2022-07-22 DIAGNOSIS — M79671 Pain in right foot: Secondary | ICD-10-CM | POA: Diagnosis not present

## 2022-07-22 DIAGNOSIS — M216X1 Other acquired deformities of right foot: Secondary | ICD-10-CM | POA: Diagnosis not present

## 2022-07-22 DIAGNOSIS — M2142 Flat foot [pes planus] (acquired), left foot: Secondary | ICD-10-CM | POA: Diagnosis not present

## 2022-07-22 DIAGNOSIS — M216X2 Other acquired deformities of left foot: Secondary | ICD-10-CM | POA: Diagnosis not present

## 2022-07-22 DIAGNOSIS — M722 Plantar fascial fibromatosis: Secondary | ICD-10-CM | POA: Diagnosis not present

## 2022-08-07 ENCOUNTER — Encounter: Payer: Self-pay | Admitting: Family Medicine

## 2022-08-07 ENCOUNTER — Ambulatory Visit (INDEPENDENT_AMBULATORY_CARE_PROVIDER_SITE_OTHER): Payer: Medicare Other | Admitting: Family Medicine

## 2022-08-07 VITALS — BP 126/72 | HR 81 | Ht 66.0 in | Wt 231.0 lb

## 2022-08-07 DIAGNOSIS — R748 Abnormal levels of other serum enzymes: Secondary | ICD-10-CM

## 2022-08-07 DIAGNOSIS — F3341 Major depressive disorder, recurrent, in partial remission: Secondary | ICD-10-CM | POA: Diagnosis not present

## 2022-08-07 DIAGNOSIS — F5101 Primary insomnia: Secondary | ICD-10-CM

## 2022-08-07 MED ORDER — BUPROPION HCL 100 MG PO TABS: 100.0000 mg | ORAL_TABLET | Freq: Two times a day (BID) | ORAL | 1 refills | Status: DC

## 2022-08-07 MED ORDER — TRAZODONE HCL 100 MG PO TABS: 100.0000 mg | ORAL_TABLET | Freq: Every day | ORAL | 1 refills | Status: DC

## 2022-08-07 NOTE — Progress Notes (Signed)
Date:  08/07/2022   Name:  Rachel Burton   DOB:  [birth year]   MRN:  [id number]   Chief Complaint: Depression and Insomnia  Depression        This is a chronic problem.  The current episode started more than 1 year ago.   The problem has been gradually improving since onset.  Associated symptoms include insomnia.  Associated symptoms include no decreased concentration, no fatigue, no helplessness, no hopelessness, not irritable, no restlessness, no decreased interest, no appetite change, no body aches, no myalgias, no headaches, no indigestion, not sad and no suicidal ideas.  Past treatments include other medications (bupropion).  Compliance with treatment is good. Insomnia Primary symptoms: no fragmented sleep, no difficulty falling asleep.   The current episode started more than one year. The onset quality is gradual. The problem has been gradually improving since onset. Past treatments include medication (trazadone). PMH includes: depression.     Lab Results  Component Value Date   NA 139 02/06/2022   K 4.4 02/06/2022   CO2 25 02/06/2022   GLUCOSE 91 02/06/2022   BUN 16 02/06/2022   CREATININE 0.85 02/06/2022   CALCIUM 9.2 02/06/2022   EGFR 75 02/06/2022   GFRNONAA 83 02/02/2020   Lab Results  Component Value Date   CHOL 215 (H) 02/06/2022   HDL 94 02/06/2022   LDLCALC 108 (H) 02/06/2022   TRIG 75 02/06/2022   CHOLHDL 2.7 01/28/2018   No results found for: "TSH" No results found for: "HGBA1C" Lab Results  Component Value Date   WBC 5.2 02/07/2022   HGB 12.2 02/07/2022   HCT 36.7 02/07/2022   MCV 94.1 02/07/2022   PLT 208 02/07/2022   Lab Results  Component Value Date   ALT 15 02/06/2022   AST 20 02/06/2022   ALKPHOS 123 (H) 02/06/2022   BILITOT 0.3 02/06/2022   No results found for: "25OHVITD2", "25OHVITD3", "VD25OH"   Review of Systems  Constitutional: Negative.  Negative for appetite change, chills, fatigue, fever and unexpected weight change.  HENT:   Negative for congestion, ear discharge, ear pain, rhinorrhea, sinus pressure, sneezing and sore throat.   Respiratory:  Negative for cough, shortness of breath, wheezing and stridor.   Gastrointestinal:  Negative for abdominal pain, blood in stool, constipation, diarrhea and nausea.  Genitourinary:  Negative for difficulty urinating, dysuria, flank pain, frequency, hematuria and vaginal bleeding.  Musculoskeletal:  Positive for arthralgias. Negative for back pain and myalgias.  Skin:  Negative for rash.  Neurological:  Negative for dizziness, weakness and headaches.  Hematological:  Negative for adenopathy. Does not bruise/bleed easily.  Psychiatric/Behavioral:  Positive for depression. Negative for decreased concentration, dysphoric mood and suicidal ideas. The patient has insomnia. The patient is not nervous/anxious.     Patient Active Problem List   Diagnosis Date Noted   Post-menopausal 02/02/2019   Insomnia 09/28/2015   Depression 09/28/2015    Allergies  Allergen Reactions   Levofloxacin Other (See Comments)    Dizziness   Zolpidem Other (See Comments)    Sleep walking    Past Surgical History:  Procedure Laterality Date   CATARACT EXTRACTION W/PHACO Right 10/28/2021   Procedure: CATARACT EXTRACTION PHACO AND INTRAOCULAR LENS PLACEMENT (IOC) RIGHT;  Surgeon: Nevada Crane, MD;  Location: [facility];  Service: Ophthalmology;  Laterality: Right;  9.63 0:48.5   CATARACT EXTRACTION W/PHACO Left 11/11/2021   Procedure: CATARACT EXTRACTION PHACO AND INTRAOCULAR LENS PLACEMENT (IOC) LEFT 9.54 00:52.3;  Surgeon: Nevada Crane,  MD;  Location: [facility];  Service: Ophthalmology;  Laterality: Left;   COLONOSCOPY  2010   cleared for 10 years   REVERSE SHOULDER ARTHROPLASTY Left 02/21/2022   Procedure: Left reverse shoulder arthroplasty, biceps tenodesis;  Surgeon: Signa Kell, MD;  Location: [facility];  Service: Orthopedics;  Laterality: Left;    Social  History   Tobacco Use   Smoking status: Never   Smokeless tobacco: Never  Vaping Use   Vaping status: Never Used  Substance Use Topics   Alcohol use: No   Drug use: No     Medication list has been reviewed and updated.  Current Meds  Medication Sig   acetaminophen (TYLENOL) 500 MG tablet Take 2 tablets (1,000 mg total) by mouth every 8 (eight) hours.   buPROPion (WELLBUTRIN) 100 MG tablet Take 1 tablet (100 mg total) by mouth 2 (two) times daily.   Multiple Vitamin (MULTIVITAMIN) tablet Take 1 tablet by mouth daily.   PSYLLIUM FIBER PO Take 5 capsules by mouth daily as needed.   traZODone (DESYREL) 100 MG tablet Take 1 tablet (100 mg total) by mouth at bedtime.   [DISCONTINUED] ondansetron (ZOFRAN-ODT) 4 MG disintegrating tablet Take 1 tablet (4 mg total) by mouth every 8 (eight) hours as needed for nausea or vomiting.   [DISCONTINUED] oxyCODONE (ROXICODONE) 5 MG immediate release tablet Take 1-2 tablets (5-10 mg total) by mouth every 4 (four) hours as needed (pain).       08/07/2022   10:39 AM 02/05/2022   10:29 AM 08/05/2021   10:10 AM 02/05/2021    8:45 AM  GAD 7 : Generalized Anxiety Score  Nervous, Anxious, on Edge 0 0 0 0  Control/stop worrying 0 0 0 0  Worry too much - different things 0 0 0 0  Trouble relaxing 0 0 0 0  Restless 0 0 0 0  Easily annoyed or irritable 0 0 0 0  Afraid - awful might happen 0 0 0 0  Total GAD 7 Score 0 0 0 0  Anxiety Difficulty Not difficult at all Not difficult at all Not difficult at all Not difficult at all       08/07/2022   10:39 AM 02/05/2022   10:29 AM 10/02/2021    1:25 PM  Depression screen PHQ 2/9  Decreased Interest 0 0 0  Down, Depressed, Hopeless 0 0 0  PHQ - 2 Score 0 0 0  Altered sleeping 0 0 3  Tired, decreased energy 0 0 3  Change in appetite 0 0 0  Feeling bad or failure about yourself  0 0 0  Trouble concentrating 0 0 0  Moving slowly or fidgety/restless 0 0 0  Suicidal thoughts 0 0 0  PHQ-9 Score 0 0 6   Difficult doing work/chores Not difficult at all Not difficult at all Not difficult at all    BP Readings from Last 3 Encounters:  08/07/22 126/72  02/21/22 (!) 144/78  02/07/22 (!) 171/89    Physical Exam Vitals and nursing note reviewed. Exam conducted with a chaperone present.  Constitutional:      General: She is not irritable.She is not in acute distress.    Appearance: She is not diaphoretic.  HENT:     Head: Normocephalic and atraumatic.     Right Ear: Tympanic membrane and external ear normal.     Left Ear: Tympanic membrane and external ear normal.     Nose: Nose normal.  Eyes:     General:  Right eye: No discharge.        Left eye: No discharge.     Conjunctiva/sclera: Conjunctivae normal.     Pupils: Pupils are equal, round, and reactive to light.  Neck:     Thyroid: No thyromegaly.     Vascular: No JVD.  Cardiovascular:     Rate and Rhythm: Normal rate and regular rhythm.     Heart sounds: Normal heart sounds, S1 normal and S2 normal. No murmur heard.    No systolic murmur is present.     No diastolic murmur is present.     No friction rub. No gallop. No S3 or S4 sounds.  Pulmonary:     Effort: Pulmonary effort is normal.     Breath sounds: Normal breath sounds.  Abdominal:     General: Bowel sounds are normal.     Palpations: Abdomen is soft. There is no mass.     Tenderness: There is no abdominal tenderness. There is no guarding.  Musculoskeletal:        General: Normal range of motion.     Cervical back: Normal range of motion and neck supple.     Right lower leg: No edema.     Left lower leg: No edema.  Lymphadenopathy:     Cervical: No cervical adenopathy.  Skin:    General: Skin is warm and dry.  Neurological:     Mental Status: She is alert.     Deep Tendon Reflexes: Reflexes are normal and symmetric.     Wt Readings from Last 3 Encounters:  08/07/22 231 lb (104.8 kg)  02/21/22 231 lb 7.7 oz (105 kg)  02/07/22 232 lb (105.2 kg)     BP 126/72   Pulse 81   Ht 5\' 6"  (1.676 m)   Wt 231 lb (104.8 kg)   SpO2 96%   BMI 37.28 kg/m   Assessment and Plan:  1. Recurrent major depressive disorder, in partial remission (HCC) Chronic.  Controlled.  Stable.  PHQ is 0 GAD score is 0.  Will continue Wellbutrin 100 mg twice a day.  Will recheck in 6 months. - buPROPion (WELLBUTRIN) 100 MG tablet; Take 1 tablet (100 mg total) by mouth 2 (two) times daily.  Dispense: 180 tablet; Refill: 1  2. Primary insomnia Chronic.  Controlled.  Stable.  Significant improvement on initiation of sleep with trazodone 100 mg nightly.  Will refill for another 6 months and will recheck in 6 months. - traZODone (DESYREL) 100 MG tablet; Take 1 tablet (100 mg total) by mouth at bedtime.  Dispense: 90 tablet; Refill: 1  3. Elevated alkaline phosphatase level Slight elevation of alk phos to 123 will check comprehensive metabolic panel with hepatic panel. - Comprehensive metabolic panel    Elizabeth Sauer, MD

## 2022-08-12 DIAGNOSIS — M216X1 Other acquired deformities of right foot: Secondary | ICD-10-CM | POA: Diagnosis not present

## 2022-08-12 DIAGNOSIS — M722 Plantar fascial fibromatosis: Secondary | ICD-10-CM | POA: Diagnosis not present

## 2022-08-12 DIAGNOSIS — M76821 Posterior tibial tendinitis, right leg: Secondary | ICD-10-CM | POA: Diagnosis not present

## 2022-08-12 DIAGNOSIS — M216X2 Other acquired deformities of left foot: Secondary | ICD-10-CM | POA: Diagnosis not present

## 2022-08-12 DIAGNOSIS — M2142 Flat foot [pes planus] (acquired), left foot: Secondary | ICD-10-CM | POA: Diagnosis not present

## 2022-08-12 DIAGNOSIS — M2141 Flat foot [pes planus] (acquired), right foot: Secondary | ICD-10-CM | POA: Diagnosis not present

## 2022-08-12 DIAGNOSIS — M79671 Pain in right foot: Secondary | ICD-10-CM | POA: Diagnosis not present

## 2022-09-23 IMAGING — MG MM DIGITAL SCREENING BILAT W/ TOMO AND CAD
8 series · 8 of 24 positions shown · non-contrast
Comparison: Previous exam(s).

CLINICAL DATA: Screening.

EXAM:
DIGITAL SCREENING BILATERAL MAMMOGRAM WITH TOMOSYNTHESIS AND CAD
TECHNIQUE: Bilateral screening digital craniocaudal and mediolateral oblique
mammograms were obtained. Bilateral screening digital breast
tomosynthesis was performed. The images were evaluated with
computer-aided detection.

[L CC synth-2D]
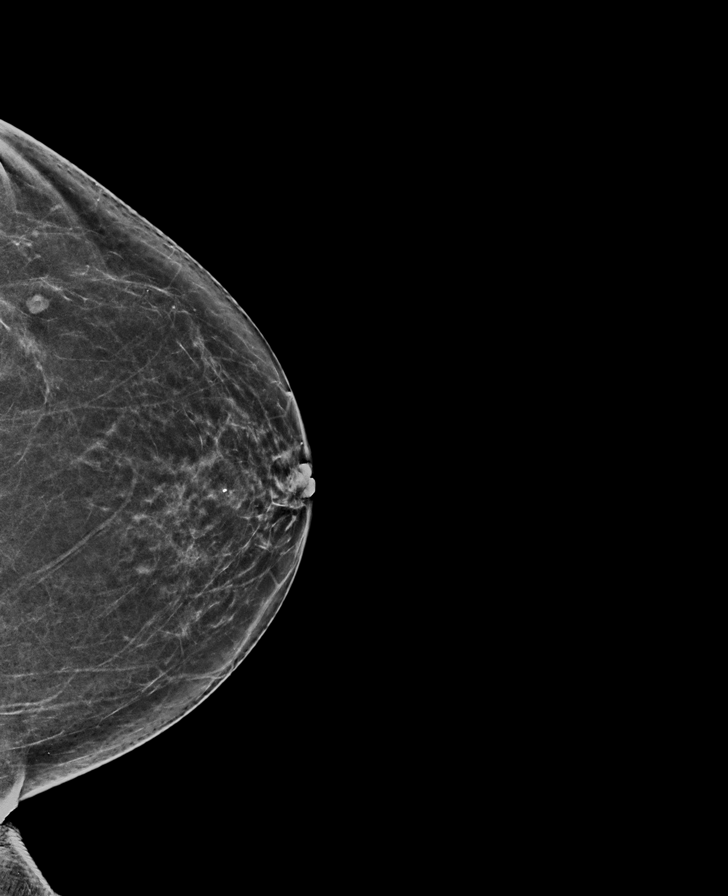

[R MLO synth-2D]
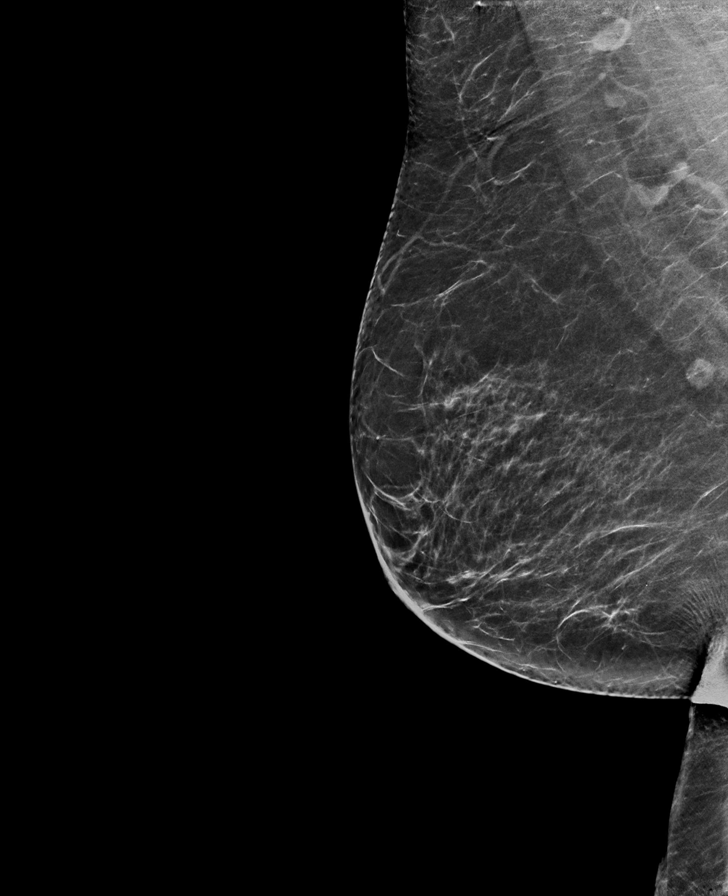

[L MLO synth-2D]
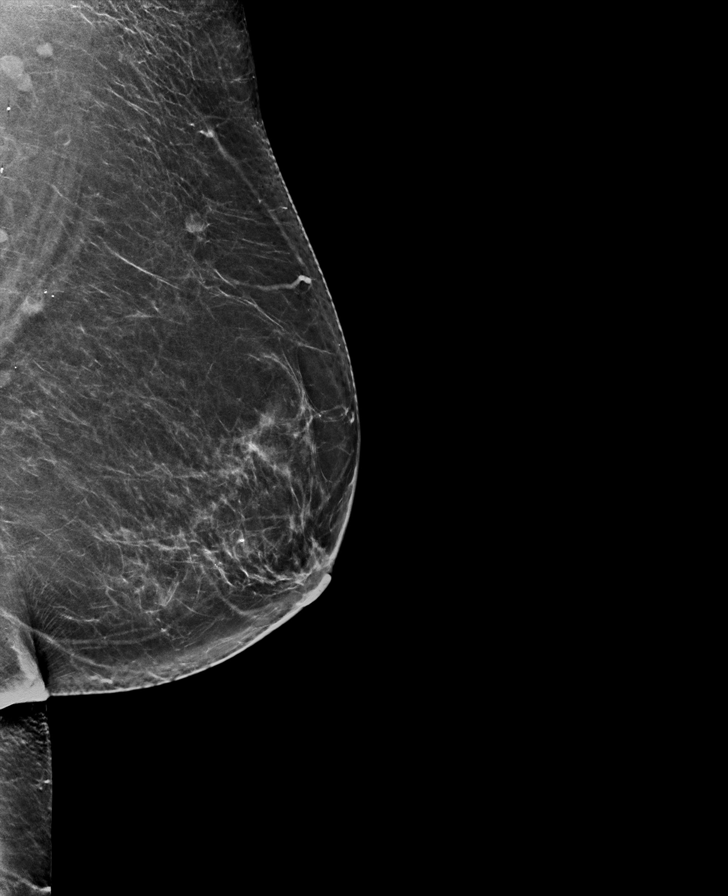

[R CC synth-2D]
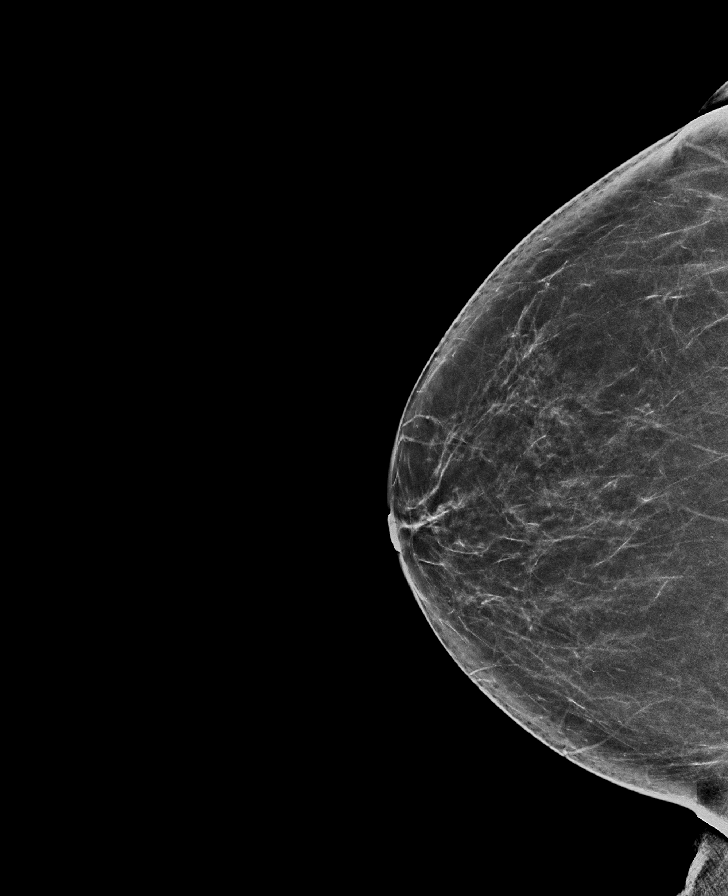

[L MLO tomo · tomo slice 37/72.0]
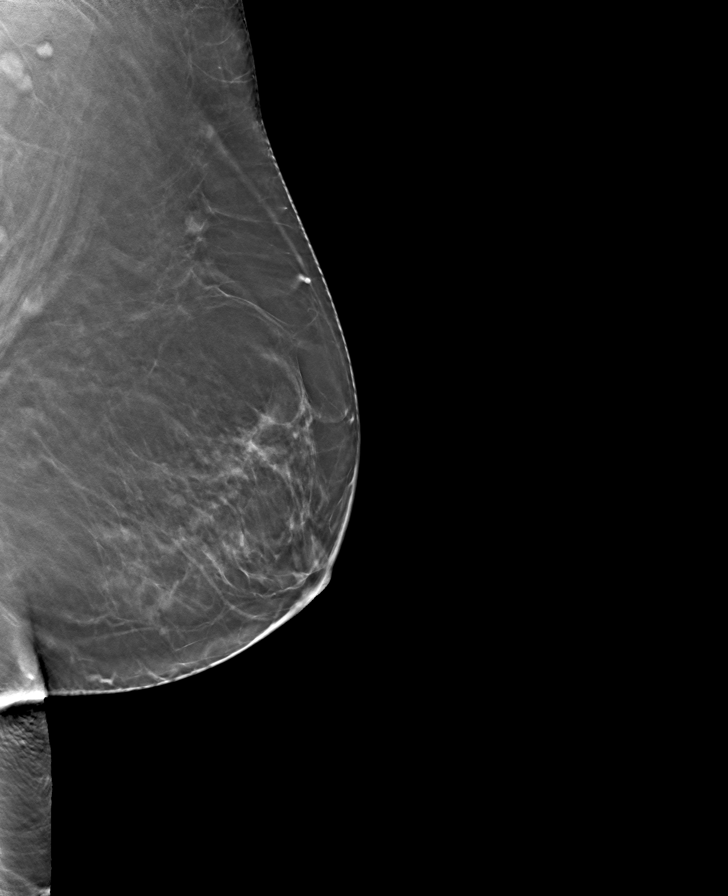

[L CC tomo · tomo slice 33/64.0]
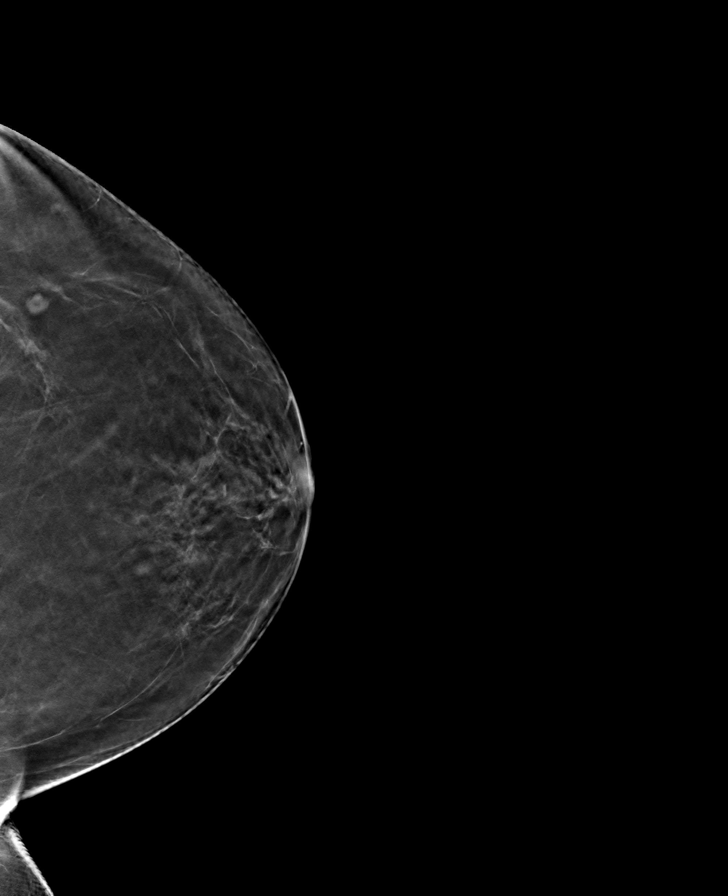

[R MLO tomo · tomo slice 37/74.0]
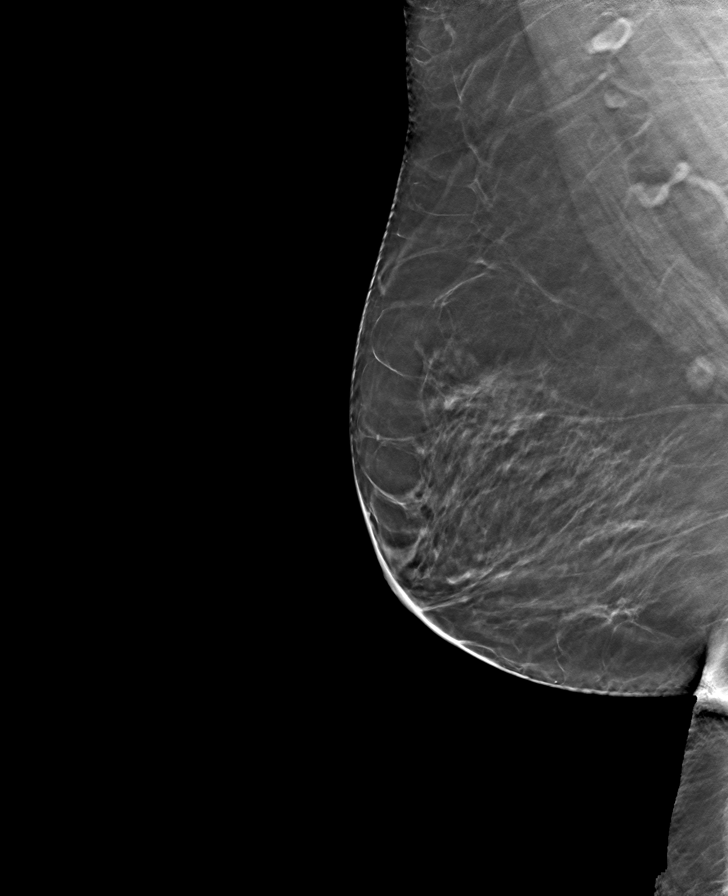

[R CC tomo · tomo slice 35/69.0]
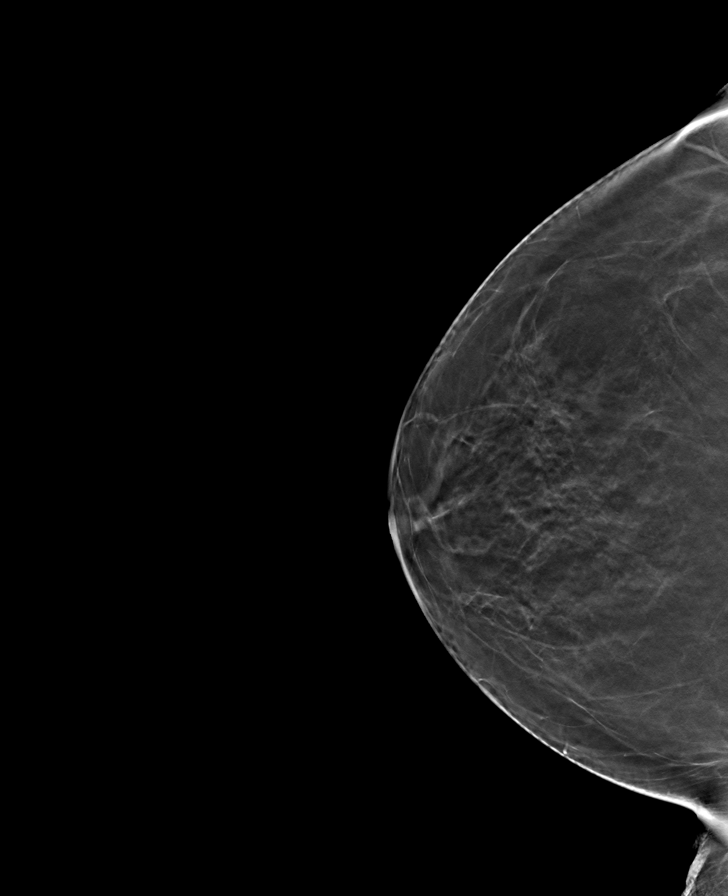

[8 of 24 positions shown; findings below may reference images not displayed]

ACR Breast Density Category b: There are scattered areas of
fibroglandular density.
FINDINGS: There are no findings suspicious for malignancy.
IMPRESSION: No mammographic evidence of malignancy. A result letter of this
screening mammogram will be mailed directly to the patient.

RECOMMENDATION:
Screening mammogram in one year. (Code:51-O-LD2)

BI-RADS CATEGORY  1: Negative.

## 2022-10-15 ENCOUNTER — Ambulatory Visit: Payer: Medicare Other

## 2022-10-15 DIAGNOSIS — Z0000 Encounter for general adult medical examination without abnormal findings: Secondary | ICD-10-CM | POA: Diagnosis not present

## 2022-10-15 NOTE — Progress Notes (Signed)
Subjective:   Rachel Burton is a 67 y.o. female who presents for Medicare Annual (Subsequent) preventive examination.  Visit Complete: Virtual I connected with  Clerance Lav on 10/15/22 by a audio enabled telemedicine application and verified that I am speaking with the correct person using two identifiers.  Patient Location: Home  Provider Location: Office/Clinic  I discussed the limitations of evaluation and management by telemedicine. The patient expressed understanding and agreed to proceed.  Vital Signs: Because this visit was a virtual/telehealth visit, some criteria may be missing or patient reported. Any vitals not documented were not able to be obtained and vitals that have been documented are patient reported.   Cardiac Risk Factors include: advanced age (>70men, >70 women);obesity (BMI >30kg/m2)     Objective:    There were no vitals filed for this visit. There is no height or weight on file to calculate BMI.     10/15/2022    1:56 PM 02/21/2022    7:51 AM 02/07/2022   10:32 AM 11/11/2021    7:39 AM 10/28/2021   10:28 AM 10/02/2021    1:25 PM 10/01/2020    1:27 PM  Advanced Directives  Does Patient Have a Medical Advance Directive? No Yes Yes Yes Yes Yes No  Type of Special educational needs teacher of [place];Living will Healthcare Power of [place];Living will Healthcare Power of Attorney  Living will   Does patient want to make changes to medical advance directive?  No - Patient declined No - Patient declined No - Patient declined No - Patient declined    Copy of Healthcare Power of Attorney in Chart?  No - copy requested No - copy requested No - copy requested     Would patient like information on creating a medical advance directive? No - Patient declined      No - Patient declined    Current Medications (verified) Outpatient Encounter Medications as of 10/15/2022  Medication Sig   acetaminophen (TYLENOL) 500 MG tablet Take 2 tablets (1,000 mg total) by  mouth every 8 (eight) hours.   buPROPion (WELLBUTRIN) 100 MG tablet Take 1 tablet (100 mg total) by mouth 2 (two) times daily.   Multiple Vitamin (MULTIVITAMIN) tablet Take 1 tablet by mouth daily.   PSYLLIUM FIBER PO Take 5 capsules by mouth daily as needed.   traZODone (DESYREL) 100 MG tablet Take 1 tablet (100 mg total) by mouth at bedtime.   No facility-administered encounter medications on file as of 10/15/2022.    Allergies (verified) Levofloxacin and Zolpidem   History: Past Medical History:  Diagnosis Date   Allergy    Depression    Dyspnea    Insomnia    Motion sickness    car - back seat   Past Surgical History:  Procedure Laterality Date   CATARACT EXTRACTION W/PHACO Right 10/28/2021   Procedure: CATARACT EXTRACTION PHACO AND INTRAOCULAR LENS PLACEMENT (IOC) RIGHT;  Surgeon: Nevada Crane, MD;  Location: [facility];  Service: Ophthalmology;  Laterality: Right;  9.63 0:48.5   CATARACT EXTRACTION W/PHACO Left 11/11/2021   Procedure: CATARACT EXTRACTION PHACO AND INTRAOCULAR LENS PLACEMENT (IOC) LEFT 9.54 00:52.3;  Surgeon: Nevada Crane, MD;  Location: [facility];  Service: Ophthalmology;  Laterality: Left;   COLONOSCOPY  2010   cleared for 10 years   REVERSE SHOULDER ARTHROPLASTY Left 02/21/2022   Procedure: Left reverse shoulder arthroplasty, biceps tenodesis;  Surgeon: Signa Kell, MD;  Location: [facility];  Service: Orthopedics;  Laterality: Left;  Family History  Problem Relation Age of Onset   Breast cancer Mother 47   Cancer Mother    Heart disease Maternal Grandmother    Hypertension Maternal Grandmother    Heart disease Maternal Grandfather    Social History   Socioeconomic History   Marital status: Widowed    Spouse name: Not on file   Number of children: 0   Years of education: Not on file   Highest education level: Not on file  Occupational History   Not on file  Tobacco Use   Smoking status: Never   Smokeless  tobacco: Never  Vaping Use   Vaping status: Never Used  Substance and Sexual Activity   Alcohol use: No   Drug use: No   Sexual activity: Not Currently    Birth control/protection: Post-menopausal  Other Topics Concern   Not on file  Social History Narrative   Pt lives alone   Social Determinants of Health   Financial Resource Strain: Low Risk  (10/15/2022)   Overall Financial Resource Strain (CARDIA)    Difficulty of Paying Living Expenses: Not hard at all  Food Insecurity: No Food Insecurity (10/15/2022)   Hunger Vital Sign    Worried About Running Out of Food in the Last Year: Never true    Ran Out of Food in the Last Year: Never true  Transportation Needs: No Transportation Needs (10/15/2022)   PRAPARE - Administrator, Civil Service (Medical): No    Lack of Transportation (Non-Medical): No  Physical Activity: Sufficiently Active (10/15/2022)   Exercise Vital Sign    Days of Exercise per Week: 7 days    Minutes of Exercise per Session: 60 min  Stress: No Stress Concern Present (10/15/2022)   [employer] of Occupational Health - Occupational Stress Questionnaire    Feeling of Stress : Not at all  Social Connections: Moderately Isolated (10/15/2022)   Social Connection and Isolation Panel [NHANES]    Frequency of Communication with Friends and Family: More than three times a week    Frequency of Social Gatherings with Friends and Family: Three times a week    Attends Religious Services: More than 4 times per year    Active Member of Clubs or Organizations: No    Attends Banker Meetings: Never    Marital Status: Widowed    Tobacco Counseling Counseling given: Not Answered   Clinical Intake:  Pre-visit preparation completed: Yes  Pain : No/denies pain     Nutritional Status: BMI > 30  Obese Nutritional Risks: None Diabetes: No  How often do you need to have someone help you when you read instructions, pamphlets, or other written  materials from your doctor or pharmacy?: 1 - Never  Interpreter Needed?: No  Information entered by :: Kennedy Bucker, LPN   Activities of Daily Living    10/15/2022    1:57 PM 02/21/2022    8:02 AM  In your present state of health, do you have any difficulty performing the following activities:  Hearing? 0 0  Vision? 0 0  Difficulty concentrating or making decisions? 0 0  Walking or climbing stairs? 0 0  Dressing or bathing? 0 0  Doing errands, shopping? 0   Preparing Food and eating ? N   Using the Toilet? N   In the past six months, have you accidently leaked urine? N   Do you have problems with loss of bowel control? N   Managing your Medications? N   Managing  your Finances? N   Housekeeping or managing your Housekeeping? N     Patient Care Team: Duanne Limerick, MD as PCP - General (Family Medicine)  Indicate any recent Medical Services you may have received from other than [facility] providers in the past year (date may be approximate).     Assessment:   This is a routine wellness examination for [place].  Hearing/Vision screen Hearing Screening - Comments:: No aids Vision Screening - Comments:: Had cataract sgy- Dr.King   Goals Addressed             This Visit's Progress    DIET - EAT MORE FRUITS AND VEGETABLES         Depression Screen    10/15/2022    1:55 PM 08/07/2022   10:39 AM 02/05/2022   10:29 AM 10/02/2021    1:25 PM 08/05/2021   10:09 AM 02/05/2021    8:45 AM 10/01/2020    1:24 PM  PHQ 2/9 Scores  PHQ - 2 Score 0 0 0 0 2 0 2  PHQ- 9 Score 0 0 0 6 5 0 3    Fall Risk    10/15/2022    1:57 PM 08/07/2022   10:39 AM 02/05/2022   10:28 AM 10/02/2021    1:25 PM 08/05/2021   10:09 AM  Fall Risk   Falls in the past year? 0 0 0 0 1  Number falls in past yr: 0 0 0 0 1  Injury with Fall? 0 0 0 0 0  Risk for fall due to : No Fall Risks No Fall Risks No Fall Risks History of fall(s) No Fall Risks  Follow up Falls prevention discussed;Falls evaluation completed  Falls evaluation completed Falls evaluation completed Falls evaluation completed Falls evaluation completed    MEDICARE RISK AT HOME: Medicare Risk at Home Any stairs in or around the home?: No If so, are there any without handrails?: No Home free of loose throw rugs in walkways, pet beds, electrical cords, etc?: Yes Adequate lighting in your home to reduce risk of falls?: Yes Life alert?: No Use of a cane, walker or w/c?: No Grab bars in the bathroom?: Yes Shower chair or bench in shower?: Yes Elevated toilet seat or a handicapped toilet?: No  TIMED UP AND GO:  Was the test performed?  No    Cognitive Function:        10/15/2022    1:58 PM 10/02/2021    1:26 PM 09/28/2019    1:35 PM  6CIT Screen  What Year? 0 points 0 points 0 points  What month? 0 points 0 points 0 points  What time? 0 points 0 points 0 points  Count back from 20 0 points 2 points 2 points  Months in reverse 0 points 0 points 0 points  Repeat phrase 0 points 2 points 0 points  Total Score 0 points 4 points 2 points    Immunizations Immunization History  Administered Date(s) Administered   DTaP 05/07/2011   Fluad Quad(high Dose 65+) 10/14/2019   Influenza,inj,Quad PF,6+ Mos 11/02/2017   Influenza-Unspecified 11/07/2011, 10/15/2018, 10/10/2020, 10/07/2021   MMR 02/07/2011   Moderna Sars-Covid-2 Vaccination 10/10/2020   PFIZER(Purple Top)SARS-COV-2 Vaccination 02/17/2019, 03/15/2019, 01/04/2020, 06/02/2020, 11/06/2021   PNEUMOCOCCAL CONJUGATE-20 02/05/2021   Pneumococcal Conjugate-13 10/14/2019   Td (Adult) 05/07/2011   Varicella 02/07/2011    TDAP status: Due, Education has been provided regarding the importance of this vaccine. Advised may receive this vaccine at local pharmacy or Health Dept. Aware  to provide a copy of the vaccination record if obtained from local pharmacy or Health Dept. Verbalized acceptance and understanding.  Flu Vaccine status: Due, Education has been provided regarding the  importance of this vaccine. Advised may receive this vaccine at local pharmacy or Health Dept. Aware to provide a copy of the vaccination record if obtained from local pharmacy or Health Dept. Verbalized acceptance and understanding.  Pneumococcal vaccine status: Up to date  Covid-19 vaccine status: Completed vaccines  Qualifies for Shingles Vaccine? Yes   Zostavax completed No   Shingrix Completed?: No.    Education has been provided regarding the importance of this vaccine. Patient has been advised to call insurance company to determine out of pocket expense if they have not yet received this vaccine. Advised may also receive vaccine at local pharmacy or Health Dept. Verbalized acceptance and understanding.  Screening Tests Health Maintenance  Topic Date Due   COVID-19 Vaccine (7 - 2023-24 season) 09/07/2022   Zoster Vaccines- Shingrix (1 of 2) 11/07/2022 (Originally 04/22/2003)   INFLUENZA VACCINE  04/06/2023 (Originally 08/07/2022)   MAMMOGRAM  04/29/2023   Medicare Annual Wellness (AWV)  10/15/2023   Pneumonia Vaccine 36+ Years old  Completed   DEXA SCAN  Completed   Hepatitis C Screening  Completed   HPV VACCINES  Aged Out   DTaP/Tdap/Td  Discontinued   Colonoscopy  Discontinued    Health Maintenance  Health Maintenance Due  Topic Date Due   COVID-19 Vaccine (7 - 2023-24 season) 09/07/2022   Declined referral for colonoscopy  Mammogram status: Completed 04/29/22. Repeat every year  Bone Density status: Completed 02/09/19. Results reflect: Bone density results: NORMAL. Repeat every 5 years.  Lung Cancer Screening: (Low Dose CT Chest recommended if Age 44-80 years, 20 pack-year currently smoking OR have quit w/in 15years.) does not qualify.    Additional Screening:  Hepatitis C Screening: does qualify; Completed 04/28/16  Vision Screening: Recommended annual ophthalmology exams for early detection of glaucoma and other disorders of the eye. Is the patient up to date with  their annual eye exam?  Yes  Who is the provider or what is the name of the office in which the patient attends annual eye exams? Dr.King If pt is not established with a provider, would they like to be referred to a provider to establish care? No .   Dental Screening: Recommended annual dental exams for proper oral hygiene   Community Resource Referral / Chronic Care Management: CRR required this visit?  No   CCM required this visit?  No     Plan:     I have personally reviewed and noted the following in the patient's chart:   Medical and social history Use of alcohol, tobacco or illicit drugs  Current medications and supplements including opioid prescriptions. Patient is not currently taking opioid prescriptions. Functional ability and status Nutritional status Physical activity Advanced directives List of other physicians Hospitalizations, surgeries, and ER visits in previous 12 months Vitals Screenings to include cognitive, depression, and falls Referrals and appointments  In addition, I have reviewed and discussed with patient certain preventive protocols, quality metrics, and best practice recommendations. A written personalized care plan for preventive services as well as general preventive health recommendations were provided to patient.     Hal Hope, LPN   21/03/863   After Visit Summary: (MyChart) Due to this being a telephonic visit, the after visit summary with patients personalized plan was offered to patient via MyChart   Nurse Notes:  none

## 2022-10-15 NOTE — Patient Instructions (Addendum)
Ms. Beckley , Thank you for taking time to come for your Medicare Wellness Visit. I appreciate your ongoing commitment to your health goals. Please review the following plan we discussed and let me know if I can assist you in the future.   Referrals/Orders/Follow-Ups/Clinician Recommendations: none  This is a list of the screening recommended for you and due dates:  Health Maintenance  Topic Date Due   COVID-19 Vaccine (7 - 2023-24 season) 09/07/2022   Zoster (Shingles) Vaccine (1 of 2) 11/07/2022*   Flu Shot  04/06/2023*   Mammogram  04/29/2023   Medicare Annual Wellness Visit  10/15/2023   Pneumonia Vaccine  Completed   DEXA scan (bone density measurement)  Completed   Hepatitis C Screening  Completed   HPV Vaccine  Aged Out   DTaP/Tdap/Td vaccine  Discontinued   Colon Cancer Screening  Discontinued  *Topic was postponed. The date shown is not the original due date.    Advanced directives: (ACP Link)Information on Advanced Care Planning can be found at [facility] Directives Advance Health Care Directives ([email or web address]    Next Medicare Annual Wellness Visit scheduled for next year: Yes   10/21/23 @ 3:00 pm by video

## 2023-02-03 ENCOUNTER — Encounter: Payer: Self-pay | Admitting: Family Medicine

## 2023-02-03 ENCOUNTER — Ambulatory Visit (INDEPENDENT_AMBULATORY_CARE_PROVIDER_SITE_OTHER): Payer: Medicare Other | Admitting: Family Medicine

## 2023-02-03 VITALS — BP 148/90 | HR 70 | Ht 66.0 in | Wt 217.0 lb

## 2023-02-03 DIAGNOSIS — I10 Essential (primary) hypertension: Secondary | ICD-10-CM

## 2023-02-03 DIAGNOSIS — F5101 Primary insomnia: Secondary | ICD-10-CM

## 2023-02-03 DIAGNOSIS — F3341 Major depressive disorder, recurrent, in partial remission: Secondary | ICD-10-CM

## 2023-02-03 MED ORDER — AMLODIPINE BESYLATE 2.5 MG PO TABS: 2.5000 mg | ORAL_TABLET | Freq: Every day | ORAL | 0 refills | Status: AC

## 2023-02-03 MED ORDER — BUPROPION HCL 100 MG PO TABS: 100.0000 mg | ORAL_TABLET | Freq: Two times a day (BID) | ORAL | 1 refills | Status: AC

## 2023-02-03 MED ORDER — TRAZODONE HCL 100 MG PO TABS: 100.0000 mg | ORAL_TABLET | Freq: Every day | ORAL | 1 refills | Status: AC

## 2023-02-03 NOTE — Progress Notes (Signed)
Date:  02/03/2023   Name:  Rachel Burton   DOB:  Dec 17, 1955   MRN:  [id number]   Chief Complaint: Depression and Insomnia  Depression        This is a chronic problem.  The current episode started more than 1 year ago.   The problem occurs intermittently.  The problem has been gradually improving since onset.  Associated symptoms include insomnia.  Associated symptoms include no decreased concentration, no fatigue, no helplessness, no hopelessness, not irritable, no restlessness, no decreased interest, no appetite change, no body aches, no myalgias, no headaches, no indigestion, not sad and no suicidal ideas.  Past treatments include other medications.  Compliance with treatment is good.  Previous treatment provided mild relief. Insomnia Primary symptoms: no sleep disturbance, no difficulty falling asleep, no frequent awakening, no malaise/fatigue.   The onset quality is sudden. The problem has been gradually improving since onset. Past treatments include medication. The treatment provided mild relief. PMH includes: depression.   Hypertension This is a chronic problem. The problem has been gradually improving since onset. The problem is controlled. Pertinent negatives include no blurred vision, chest pain, headaches, malaise/fatigue, neck pain, orthopnea, palpitations, PND, shortness of breath or sweats. There are no associated agents to hypertension. Risk factors for coronary artery disease include family history. Past treatments include lifestyle changes. The current treatment provides mild improvement.    Lab Results  Component Value Date   NA 138 08/07/2022   K 4.2 08/07/2022   CO2 26 08/07/2022   GLUCOSE 88 08/07/2022   BUN 18 08/07/2022   CREATININE 0.91 08/07/2022   CALCIUM 9.4 08/07/2022   EGFR 68 08/07/2022   GFRNONAA 83 02/02/2020   Lab Results  Component Value Date   CHOL 215 (H) 02/06/2022   HDL 94 02/06/2022   LDLCALC 108 (H) 02/06/2022   TRIG 75 02/06/2022    CHOLHDL 2.7 01/28/2018   No results found for: "TSH" No results found for: "HGBA1C" Lab Results  Component Value Date   WBC 5.2 02/07/2022   HGB 12.2 02/07/2022   HCT 36.7 02/07/2022   MCV 94.1 02/07/2022   PLT 208 02/07/2022   Lab Results  Component Value Date   ALT 13 08/07/2022   AST 20 08/07/2022   ALKPHOS 110 08/07/2022   BILITOT 0.2 08/07/2022   No results found for: "25OHVITD2", "25OHVITD3", "VD25OH"   Review of Systems  Constitutional:  Negative for appetite change, fatigue, malaise/fatigue and unexpected weight change.  Eyes:  Negative for blurred vision and visual disturbance.  Respiratory:  Negative for cough, shortness of breath and wheezing.   Cardiovascular:  Negative for chest pain, palpitations, orthopnea and PND.  Gastrointestinal:  Negative for abdominal distention and abdominal pain.  Endocrine: Negative for polydipsia, polyphagia and polyuria.  Genitourinary:  Negative for difficulty urinating.  Musculoskeletal:  Negative for myalgias and neck pain.  Neurological:  Negative for headaches.  Psychiatric/Behavioral:  Positive for depression. Negative for decreased concentration, sleep disturbance and suicidal ideas. The patient has insomnia.     Patient Active Problem List   Diagnosis Date Noted   Post-menopausal 02/02/2019   Insomnia 09/28/2015   Depression 09/28/2015    Allergies  Allergen Reactions   Levofloxacin Other (See Comments)    Dizziness   Zolpidem Other (See Comments)    Sleep walking    Past Surgical History:  Procedure Laterality Date   CATARACT EXTRACTION W/PHACO Right 10/28/2021   Procedure: CATARACT EXTRACTION PHACO AND INTRAOCULAR LENS PLACEMENT (IOC) RIGHT;  Surgeon: [place] Crane, MD;  Location: [facility];  Service: Ophthalmology;  Laterality: Right;  9.63 0:48.5   CATARACT EXTRACTION W/PHACO Left 11/11/2021   Procedure: CATARACT EXTRACTION PHACO AND INTRAOCULAR LENS PLACEMENT (IOC) LEFT 9.54 00:52.3;  Surgeon:  [place] Crane, MD;  Location: [facility];  Service: Ophthalmology;  Laterality: Left;   COLONOSCOPY  2010   cleared for 10 years   REVERSE SHOULDER ARTHROPLASTY Left 02/21/2022   Procedure: Left reverse shoulder arthroplasty, biceps tenodesis;  Surgeon: Signa Kell, MD;  Location: [facility];  Service: Orthopedics;  Laterality: Left;    Social History   Tobacco Use   Smoking status: Never   Smokeless tobacco: Never  Vaping Use   Vaping status: Never Used  Substance Use Topics   Alcohol use: No   Drug use: No     Medication list has been reviewed and updated.  Current Meds  Medication Sig   acetaminophen (TYLENOL) 500 MG tablet Take 2 tablets (1,000 mg total) by mouth every 8 (eight) hours.   buPROPion (WELLBUTRIN) 100 MG tablet Take 1 tablet (100 mg total) by mouth 2 (two) times daily.   Multiple Vitamin (MULTIVITAMIN) tablet Take 1 tablet by mouth daily.   PSYLLIUM FIBER PO Take 5 capsules by mouth daily as needed.   traZODone (DESYREL) 100 MG tablet Take 1 tablet (100 mg total) by mouth at bedtime.       02/03/2023    8:10 AM 08/07/2022   10:39 AM 02/05/2022   10:29 AM 08/05/2021   10:10 AM  GAD 7 : Generalized Anxiety Score  Nervous, Anxious, on Edge 0 0 0 0  Control/stop worrying 0 0 0 0  Worry too much - different things 0 0 0 0  Trouble relaxing 0 0 0 0  Restless 0 0 0 0  Easily annoyed or irritable 0 0 0 0  Afraid - awful might happen 0 0 0 0  Total GAD 7 Score 0 0 0 0  Anxiety Difficulty Not difficult at all Not difficult at all Not difficult at all Not difficult at all       02/03/2023    8:10 AM 10/15/2022    1:55 PM 08/07/2022   10:39 AM  Depression screen PHQ 2/9  Decreased Interest 1 0 0  Down, Depressed, Hopeless 1 0 0  PHQ - 2 Score 2 0 0  Altered sleeping 1 0 0  Tired, decreased energy 0 0 0  Change in appetite 0 0 0  Feeling bad or failure about yourself  0 0 0  Trouble concentrating 0 0 0  Moving slowly or fidgety/restless 0 0 0   Suicidal thoughts 0 0 0  PHQ-9 Score 3 0 0  Difficult doing work/chores Not difficult at all Not difficult at all Not difficult at all    BP Readings from Last 3 Encounters:  02/03/23 (!) 146/82  08/07/22 126/72  02/21/22 (!) 144/78    Physical Exam Vitals and nursing note reviewed.  Constitutional:      General: She is not irritable.She is not in acute distress.    Appearance: She is not diaphoretic.  HENT:     Head: Normocephalic and atraumatic.     Right Ear: External ear normal.     Left Ear: External ear normal.     Nose: Nose normal.  Eyes:     General:        Right eye: No discharge.        Left eye:  No discharge.     Conjunctiva/sclera: Conjunctivae normal.     Pupils: Pupils are equal, round, and reactive to light.  Neck:     Thyroid: No thyromegaly.     Vascular: No JVD.  Cardiovascular:     Rate and Rhythm: Normal rate and regular rhythm.     Heart sounds: Normal heart sounds, S1 normal and S2 normal. No murmur heard.    No systolic murmur is present.     No diastolic murmur is present.     No friction rub. No gallop.  Pulmonary:     Effort: Pulmonary effort is normal.     Breath sounds: Normal breath sounds. No wheezing, rhonchi or rales.  Abdominal:     General: Bowel sounds are normal.     Palpations: Abdomen is soft. There is no mass.     Tenderness: There is no abdominal tenderness. There is no guarding.  Musculoskeletal:     Cervical back: Normal range of motion and neck supple.  Lymphadenopathy:     Cervical: No cervical adenopathy.  Skin:    General: Skin is warm.  Neurological:     Mental Status: She is alert.     Deep Tendon Reflexes: Reflexes are normal and symmetric.     Wt Readings from Last 3 Encounters:  02/03/23 217 lb (98.4 kg)  08/07/22 231 lb (104.8 kg)  02/21/22 231 lb 7.7 oz (105 kg)    BP (!) 146/82   Pulse 70   Ht 5\' 6"  (1.676 m)   Wt 217 lb (98.4 kg)   SpO2 98%   BMI 35.02 kg/m   Assessment and Plan:  1.  Recurrent major depressive disorder, in partial remission (HCC) Chronic.  Controlled.  Stable.  PHQ is 0 GAD score 0 continue Wellbutrin 100 mg twice a day.  Will recheck in 6 months. - buPROPion (WELLBUTRIN) 100 MG tablet; Take 1 tablet (100 mg total) by mouth 2 (two) times daily.  Dispense: 180 tablet; Refill: 1  2. Primary insomnia Chronic.  Controlled.  Stable.  Achieving sleep and maintaining on trazodone 100 mg nightly.  Will continue at current dosing.  Will recheck patient in 6 months. - traZODone (DESYREL) 100 MG tablet; Take 1 tablet (100 mg total) by mouth at bedtime.  Dispense: 90 tablet; Refill: 1  3. Primary hypertension (Primary) Relatively new onset in but has been elevated in the past.  Patient has tried with diet but it appears that were not going to need to initiate medication therapy.  We will start with amlodipine 2.5 mg once a day.  Patient will return with her physical next month and we will recheck blood pressure at that time and obtain labs at that time as well. - amLODipine (NORVASC) 2.5 MG tablet; Take 1 tablet (2.5 mg total) by mouth daily.  Dispense: 30 tablet; Refill: 0    Elizabeth Sauer, MD

## 2023-02-19 ENCOUNTER — Encounter: Payer: Self-pay | Admitting: Family Medicine

## 2023-02-19 DIAGNOSIS — Z0000 Encounter for general adult medical examination without abnormal findings: Secondary | ICD-10-CM | POA: Diagnosis not present

## 2023-02-19 DIAGNOSIS — Z131 Encounter for screening for diabetes mellitus: Secondary | ICD-10-CM | POA: Diagnosis not present

## 2023-02-19 DIAGNOSIS — Z1231 Encounter for screening mammogram for malignant neoplasm of breast: Secondary | ICD-10-CM | POA: Diagnosis not present

## 2023-02-19 DIAGNOSIS — F5101 Primary insomnia: Secondary | ICD-10-CM | POA: Diagnosis not present

## 2023-02-19 DIAGNOSIS — I10 Essential (primary) hypertension: Secondary | ICD-10-CM | POA: Diagnosis not present

## 2023-02-19 DIAGNOSIS — Z13220 Encounter for screening for lipoid disorders: Secondary | ICD-10-CM | POA: Diagnosis not present

## 2023-02-19 DIAGNOSIS — R7309 Other abnormal glucose: Secondary | ICD-10-CM | POA: Diagnosis not present

## 2023-02-24 ENCOUNTER — Other Ambulatory Visit: Payer: Self-pay | Admitting: Internal Medicine

## 2023-02-24 DIAGNOSIS — Z1231 Encounter for screening mammogram for malignant neoplasm of breast: Secondary | ICD-10-CM

## 2023-03-26 DIAGNOSIS — M25511 Pain in right shoulder: Secondary | ICD-10-CM | POA: Diagnosis not present

## 2023-03-26 DIAGNOSIS — G8929 Other chronic pain: Secondary | ICD-10-CM | POA: Diagnosis not present

## 2023-05-05 ENCOUNTER — Ambulatory Visit
Admission: RE | Admit: 2023-05-05 | Discharge: 2023-05-05 | Disposition: A | Discharge status: Home / Self Care | Source: Ambulatory Visit | Attending: Internal Medicine | Admitting: Internal Medicine

## 2023-05-05 ENCOUNTER — Ambulatory Visit

## 2023-05-05 DIAGNOSIS — Z1231 Encounter for screening mammogram for malignant neoplasm of breast: Secondary | ICD-10-CM | POA: Diagnosis not present
